# Patient Record
Sex: Male | Born: 1995
Health system: Southern US, Community
[De-identification: ages and names within clinical notes are randomized; demographics above are authoritative.]

## PROBLEM LIST (undated history)

## (undated) DIAGNOSIS — F419 Anxiety disorder, unspecified: Secondary | ICD-10-CM

## (undated) DIAGNOSIS — L410 Pityriasis lichenoides et varioliformis acuta: Secondary | ICD-10-CM

## (undated) DIAGNOSIS — R760 Raised antibody titer: Secondary | ICD-10-CM

## (undated) DIAGNOSIS — M79676 Pain in unspecified toe(s): Secondary | ICD-10-CM

## (undated) DIAGNOSIS — N50819 Testicular pain, unspecified: Secondary | ICD-10-CM

## (undated) DIAGNOSIS — K580 Irritable bowel syndrome with diarrhea: Secondary | ICD-10-CM

## (undated) DIAGNOSIS — R3129 Other microscopic hematuria: Secondary | ICD-10-CM

## (undated) DIAGNOSIS — N509 Disorder of male genital organs, unspecified: Secondary | ICD-10-CM

## (undated) DIAGNOSIS — L98499 Non-pressure chronic ulcer of skin of other sites with unspecified severity: Secondary | ICD-10-CM

## (undated) DIAGNOSIS — M2550 Pain in unspecified joint: Secondary | ICD-10-CM

## (undated) DIAGNOSIS — R590 Localized enlarged lymph nodes: Secondary | ICD-10-CM

## (undated) DIAGNOSIS — A281 Cat-scratch disease: Secondary | ICD-10-CM

## (undated) HISTORY — DX: Cat-scratch disease: A28.1

## (undated) HISTORY — DX: Raised antibody titer: R76.0

## (undated) HISTORY — DX: Non-pressure chronic ulcer of skin of other sites with unspecified severity: L98.499

## (undated) HISTORY — DX: Anxiety disorder, unspecified: F41.9

## (undated) HISTORY — DX: Pityriasis lichenoides et varioliformis acuta: L41.0

## (undated) HISTORY — DX: Testicular pain, unspecified: N50.819

## (undated) HISTORY — DX: Disorder of male genital organs, unspecified: N50.9

## (undated) HISTORY — DX: Pain in unspecified toe(s): M79.676

## (undated) HISTORY — DX: Irritable bowel syndrome with diarrhea: K58.0

---
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
## 2013-01-03 ENCOUNTER — Ambulatory Visit (INDEPENDENT_AMBULATORY_CARE_PROVIDER_SITE_OTHER): Payer: 59 | Admitting: Family Medicine

## 2013-01-03 ENCOUNTER — Encounter: Payer: Self-pay | Admitting: Family Medicine

## 2013-01-03 VITALS — BP 116/67 | HR 73 | Ht 72.0 in | Wt 170.0 lb

## 2013-01-03 DIAGNOSIS — M79609 Pain in unspecified limb: Secondary | ICD-10-CM

## 2013-01-03 DIAGNOSIS — M79671 Pain in right foot: Secondary | ICD-10-CM

## 2013-01-03 NOTE — Assessment & Plan Note (Signed)
Bilateral plantar fasciitis - shown home exercise program to do daily.  Arch binders provided and encouraged to use his otc orthotics regularly.  Icing, tylenol/nsaids as needed.  Avoid flat shoes and barefoot walking.  Consider injection, PT if not improving.  F/u in 1 month.

## 2013-01-03 NOTE — Patient Instructions (Addendum)
You have plantar fasciitis Take tylenol or aleve as needed for pain  Plantar fascia stretch for 20-30 seconds (do 3 of these) in morning Lowering/raise on a step exercises 3 x 10 once or twice a day - this is very important for long term recovery. Can add heel walks, toe walks forward and backward as well Ice heel for 15 minutes as needed. Avoid flat shoes/barefoot walking as much as possible. Arch straps have been shown to help with pain - wear these when up and walking around. Orthotics with heel lift may be helpful - use the Dr. Jari Sportsman ones regularly. Steroid injection is a consideration for short term pain relief if you are struggling. Physical therapy is also an option. Follow up with me in 1 month for reevaluation.  If your wrists become bothersome enough I would recommend trying cockup wrist splints when you sleep as the first step.

## 2013-01-03 NOTE — Progress Notes (Signed)
Patient ID: Cody Todd, male   DOB: Apr 16, 1996, 17 y.o.   MRN: [id number]  PCP: Ferman Hamming, MD  Subjective:   HPI: Patient is a 17 y.o. male here for bilateral foot pain.  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 About 2 months ago started to get left worse than right heel pain plantar part of heels. Tried some dr. Jari Sportsman inserts., icing, ice bottle massage. No swelling or bruising. Worse at end of day and when on feet a lot. Difficult to put weight on either heel.  Past Medical History  Diagnosis Date  . Pityriasis lichenoides 02/11/2011  . ADHD (attention deficit hyperactivity disorder)   . Sinusitis     2003,2007,2012   . Anxiety   . Irritable bowel syndrome with diarrhea 03/29/2012    No current outpatient prescriptions on file prior to visit.   No current facility-administered medications on file prior to visit.    Past Surgical History  Procedure Laterality Date  . Skin biopsy      Dr. Doristine Section, pityriasis lichenoides    No Known Allergies  History   Social History  . Marital Status: Single    Spouse Name: N/A    Number of Children: N/A  . Years of Education: N/A   Occupational History  . Not on file.   Social History Main Topics  . Smoking status: Never Smoker   . Smokeless tobacco: Never Used  . Alcohol Use: No  . Drug Use: 1.00 per week    Special: Marijuana     Comment: was using more, but has cut down a lot since has girlfriend. Feels less anxious.  . Sexually Active: Not Currently -- Male partner(s)    Birth Control/ Protection: Condom     Comment: girlfriend on BCP but states they are not having sex yet, but he has condoms   Other Topics Concern  . Not on file   Social History Narrative   Lives with mom and dad and 3 brothers.   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           Family History  Problem Relation Age of Onset  . Obesity Father   . ADD / ADHD Brother   . Depression Brother   . Mitral valve  prolapse Maternal Grandmother   . Cancer Maternal Grandmother     uterine  . Dementia Maternal Grandfather   . Thyroid disease Maternal Grandfather   . Cancer Paternal Grandfather     renal  . Hypertension Mother     BP 116/67  Pulse 73  Ht 6' (1.829 m)  Wt 170 lb (77.111 kg)  BMI 23.05 kg/m2  Review of Systems: See HPI above.    Objective:  Physical Exam:  Gen: NAD  Bilateral feet/ankles: No gross deformity, swelling, ecchymoses Mild cavus. FROM ankles with 5/5 strength all directions. TTP anterior plantar calcaneus bilaterally.  No other TTP feet/ankles. Negative ant drawer and talar tilt.   Negative syndesmotic compression. Thompsons test negative. NV intact distally.    Assessment & Plan:  1. Bilateral plantar fasciitis - shown home exercise program to do daily.  Arch binders provided and encouraged to use his otc orthotics regularly.  Icing, tylenol/nsaids as needed.  Avoid flat shoes and barefoot walking.  Consider injection, PT if not improving.  F/u in 1 month.

## 2013-01-17 ENCOUNTER — Ambulatory Visit
Admission: RE | Admit: 2013-01-17 | Discharge: 2013-01-17 | Disposition: A | Discharge status: Home / Self Care | Payer: 59 | Source: Ambulatory Visit | Attending: Orthopedic Surgery | Admitting: Orthopedic Surgery

## 2013-01-17 ENCOUNTER — Other Ambulatory Visit: Payer: Self-pay | Admitting: Orthopedic Surgery

## 2013-01-17 DIAGNOSIS — S62002B Unspecified fracture of navicular [scaphoid] bone of left wrist, initial encounter for open fracture: Secondary | ICD-10-CM

## 2013-01-31 ENCOUNTER — Ambulatory Visit: Payer: 59 | Admitting: Family Medicine

## 2013-05-26 ENCOUNTER — Ambulatory Visit (INDEPENDENT_AMBULATORY_CARE_PROVIDER_SITE_OTHER): Payer: 59 | Admitting: Pediatrics

## 2013-05-26 DIAGNOSIS — Z23 Encounter for immunization: Secondary | ICD-10-CM

## 2013-05-27 NOTE — Progress Notes (Signed)
Presented today for flu vaccine. No new questions on vaccine. Parent was counseled on risks benefits of vaccine and parent verbalized understanding. Handout (VIS) given for each vaccine. 

## 2013-11-07 ENCOUNTER — Encounter: Payer: Self-pay | Admitting: Family Medicine

## 2013-11-07 ENCOUNTER — Ambulatory Visit (INDEPENDENT_AMBULATORY_CARE_PROVIDER_SITE_OTHER): Payer: 59 | Admitting: Family Medicine

## 2013-11-07 ENCOUNTER — Other Ambulatory Visit: Payer: Self-pay | Admitting: *Deleted

## 2013-11-07 ENCOUNTER — Ambulatory Visit (HOSPITAL_BASED_OUTPATIENT_CLINIC_OR_DEPARTMENT_OTHER)
Admission: RE | Admit: 2013-11-07 | Discharge: 2013-11-07 | Disposition: A | Discharge status: Home / Self Care | Payer: 59 | Source: Ambulatory Visit | Attending: Family Medicine | Admitting: Family Medicine

## 2013-11-07 VITALS — BP 118/75 | HR 98 | Ht 72.0 in | Wt 165.0 lb

## 2013-11-07 DIAGNOSIS — M25579 Pain in unspecified ankle and joints of unspecified foot: Secondary | ICD-10-CM | POA: Insufficient documentation

## 2013-11-07 DIAGNOSIS — M25571 Pain in right ankle and joints of right foot: Secondary | ICD-10-CM

## 2013-11-07 NOTE — Progress Notes (Addendum)
Patient ID: Cody Todd, male   DOB: 1995/12/18, 18 y.o.   MRN: [id number]  PCP: Gaynelle Arabian, MD  Subjective:   HPI: Patient is a 18 y.o. male here for right ankle pain.  Patient reports for about 2 months now he has had deep lateral ankle pain. No known injury to start with. Gets sharp stabbing sensation taht causes ankle to give out at times. Radiates to inner part of ankle. Ankle feels weak also. Has been wrapping, doing some exercises without much beneft. Takes advil as needed. No prior issues.  Past Medical History  Diagnosis Date  . 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 ADHD (attention deficit hyperactivity disorder)   . Sinusitis     2003,2007,2012   . Anxiety   . Irritable bowel syndrome with diarrhea 03/29/2012    No current outpatient prescriptions on file prior to visit.   No current facility-administered medications on file prior to visit.    Past Surgical History  Procedure Laterality Date  . Skin biopsy      Dr. Vanessa Kick, pityriasis lichenoides    No Known Allergies  History   Social History  . Marital Status: Single    Spouse Name: N/A    Number of Children: N/A  . Years of Education: N/A   Occupational History  . Not on file.   Social History Main Topics  . Smoking status: Never Smoker   . Smokeless tobacco: Never Used  . Alcohol Use: No  . Drug Use: 1.00 per week    Special: Marijuana     Comment: was using more, but has cut down a lot since has girlfriend. Feels less anxious.  . Sexual Activity: Not Currently    Partners: Female    Birth Control/ Protection: Condom     Comment: girlfriend on BCP but states they are not having sex yet, but he has condoms   Other Topics Concern  . Not on file   Social History Narrative   Lives with mom and dad and 3 brothers.   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           Family History  Problem Relation Age of Onset  . Obesity Father   . ADD / ADHD Brother   . Depression Brother   . Mitral  valve prolapse Maternal Grandmother   . Cancer Maternal Grandmother     uterine  . Dementia Maternal Grandfather   . Thyroid disease Maternal Grandfather   . Cancer Paternal Grandfather     renal  . Hypertension Mother     BP 118/75  Pulse 98  Ht 6' (1.829 m)  Wt 165 lb (74.844 kg)  BMI 22.37 kg/m2  Review of Systems: See HPI above.    Objective:  Physical Exam:  Gen: NAD  Right ankle: No gross deformity, swelling, ecchymoses FROM with pain on IR. TTP at sinus tarsi region mildly (reports pain feels deeper than this). Negative ant drawer and talar tilt.   Negative syndesmotic compression. Thompsons test negative. NV intact distally.    Assessment & Plan:  1. Right ankle pain - concerning for ankle impingement vs sinus tarsi vs OCD.  No injury to suggest OCD but radiographs being done today to assess.  Ankle impingement most likely diagnosis.  Ankle brace, better arch supports (sports insoles with scaphoid pads provided), icing, tylenol, aleve.  Theraband strengthening exercises.  F/u in 1 month to 6 weeks.  Addendum:  Radiographs reviewed, discussed with patient and his mother.  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  Also does not have a known injury that would correspond to this diagnosis.  Possible this may be an old chronic finding.  Regardless will get MRI as soon as possible to assess old vs new and staging.  Has been ambulatory on this for 2 months already.  Based on results may need surgery referral, restricted weight bearing, or continue current treatment and consider injection (if chronic).  Addendum:  Patient brought in with mother and reviewed MRI.  The OCD has surrounding edema and is not an unstable lesion, appears to be Grade 1 or 2.  Based on size will refer to foot/ankle specialist.  In meantime placed in posterior splint with crutches here in the office - this will start conservative management should this be the option  recommended by specialist.  Out of work in the meantime.

## 2013-11-07 NOTE — Assessment & Plan Note (Signed)
concerning for ankle impingement vs sinus tarsi vs OCD.  No injury to suggest OCD but radiographs being done today to assess.  Ankle impingement most likely diagnosis.  Ankle brace, better arch supports (sports insoles with scaphoid pads provided), icing, tylenol, aleve.  Theraband strengthening exercises.  F/u in 1 month to 6 weeks.

## 2013-11-07 NOTE — Patient Instructions (Signed)
Your history and exam are most consistent with ankle impingement which is difficult to treat. Wear ankle brace for support when up and walking around. Arch supports when up and walking also. Avoid barefoot walking, flat shoes as much as possible. Get x-rays today before you leave. Icing, tylenol, aleve if needed. Rest when possible at work. Start theraband strengthening exercises - once a day 3 sets of 10. Follow up with me in 1 month to 6 weeks for reevaluation.

## 2013-11-10 ENCOUNTER — Ambulatory Visit (HOSPITAL_COMMUNITY)
Admission: RE | Admit: 2013-11-10 | Discharge: 2013-11-10 | Disposition: A | Discharge status: Home / Self Care | Payer: 59 | Source: Ambulatory Visit | Attending: Family Medicine | Admitting: Family Medicine

## 2013-11-10 DIAGNOSIS — M79609 Pain in unspecified limb: Secondary | ICD-10-CM | POA: Insufficient documentation

## 2013-11-10 DIAGNOSIS — M25571 Pain in right ankle and joints of right foot: Secondary | ICD-10-CM

## 2013-11-11 ENCOUNTER — Encounter: Payer: Self-pay | Admitting: Family Medicine

## 2013-11-11 ENCOUNTER — Ambulatory Visit: Payer: 59 | Admitting: Family Medicine

## 2013-11-18 ENCOUNTER — Telehealth: Payer: Self-pay | Admitting: Family Medicine

## 2013-11-22 ENCOUNTER — Other Ambulatory Visit: Payer: Self-pay | Admitting: *Deleted

## 2013-11-22 DIAGNOSIS — M25571 Pain in right ankle and joints of right foot: Secondary | ICD-10-CM

## 2013-11-25 ENCOUNTER — Ambulatory Visit: Payer: 59 | Admitting: Family Medicine

## 2013-12-01 ENCOUNTER — Encounter (HOSPITAL_BASED_OUTPATIENT_CLINIC_OR_DEPARTMENT_OTHER): Payer: Self-pay | Admitting: *Deleted

## 2013-12-08 ENCOUNTER — Encounter (HOSPITAL_BASED_OUTPATIENT_CLINIC_OR_DEPARTMENT_OTHER): Payer: Self-pay | Admitting: *Deleted

## 2013-12-08 ENCOUNTER — Ambulatory Visit (HOSPITAL_BASED_OUTPATIENT_CLINIC_OR_DEPARTMENT_OTHER): Admission: RE | Admit: 2013-12-08 | Payer: 59 | Source: Ambulatory Visit | Admitting: Orthopedic Surgery

## 2013-12-08 SURGERY — ARTHROSCOPY, ANKLE
Anesthesia: General | Site: Ankle | Laterality: Right

## 2013-12-13 ENCOUNTER — Ambulatory Visit: Payer: 59 | Admitting: Family Medicine

## 2013-12-14 ENCOUNTER — Other Ambulatory Visit: Payer: Self-pay | Admitting: Orthopedic Surgery

## 2013-12-15 ENCOUNTER — Encounter (HOSPITAL_BASED_OUTPATIENT_CLINIC_OR_DEPARTMENT_OTHER): Payer: Self-pay | Admitting: Certified Registered"

## 2013-12-15 ENCOUNTER — Ambulatory Visit (HOSPITAL_BASED_OUTPATIENT_CLINIC_OR_DEPARTMENT_OTHER)
Admission: RE | Admit: 2013-12-15 | Discharge: 2013-12-15 | Disposition: A | Discharge status: Home / Self Care | Payer: 59 | Source: Ambulatory Visit | Attending: Orthopedic Surgery | Admitting: Orthopedic Surgery

## 2013-12-15 ENCOUNTER — Ambulatory Visit (HOSPITAL_BASED_OUTPATIENT_CLINIC_OR_DEPARTMENT_OTHER): Payer: 59 | Admitting: Certified Registered"

## 2013-12-15 ENCOUNTER — Encounter (HOSPITAL_BASED_OUTPATIENT_CLINIC_OR_DEPARTMENT_OTHER): Payer: 59 | Admitting: Certified Registered"

## 2013-12-15 ENCOUNTER — Encounter (HOSPITAL_BASED_OUTPATIENT_CLINIC_OR_DEPARTMENT_OTHER)
Admission: RE | Disposition: A | Discharge status: Home / Self Care | Payer: Self-pay | Source: Ambulatory Visit | Attending: Orthopedic Surgery

## 2013-12-15 DIAGNOSIS — M659 Unspecified synovitis and tenosynovitis, unspecified site: Secondary | ICD-10-CM | POA: Insufficient documentation

## 2013-12-15 DIAGNOSIS — M899 Disorder of bone, unspecified: Secondary | ICD-10-CM

## 2013-12-15 DIAGNOSIS — M949 Disorder of cartilage, unspecified: Secondary | ICD-10-CM

## 2013-12-15 DIAGNOSIS — M959 Acquired deformity of musculoskeletal system, unspecified: Secondary | ICD-10-CM | POA: Insufficient documentation

## 2013-12-15 DIAGNOSIS — E739 Lactose intolerance, unspecified: Secondary | ICD-10-CM | POA: Insufficient documentation

## 2013-12-15 DIAGNOSIS — L419 Parapsoriasis, unspecified: Secondary | ICD-10-CM | POA: Insufficient documentation

## 2013-12-15 DIAGNOSIS — K589 Irritable bowel syndrome without diarrhea: Secondary | ICD-10-CM | POA: Insufficient documentation

## 2013-12-15 HISTORY — PX: ANKLE ARTHROSCOPY WITH DRILLING/MICROFRACTURE: SHX5580

## 2013-12-15 LAB — POCT HEMOGLOBIN-HEMACUE: Hemoglobin: 15.6 g/dL (ref 13.0–17.0)

## 2013-12-15 SURGERY — ARTHROSCOPY, ANKLE, WITH MICROFRACTURE
Anesthesia: General | Laterality: Right

## 2013-12-15 MED ORDER — OXYCODONE HCL 5 MG PO TABS
5.0000 mg | ORAL_TABLET | Freq: Once | ORAL | Status: AC | PRN
  Administered 2013-12-15: 5 mg via ORAL

## 2013-12-15 MED ORDER — BUPIVACAINE-EPINEPHRINE 0.5% -1:200000 IJ SOLN
INTRAMUSCULAR | Status: DC | PRN
  Administered 2013-12-15: 7 mL

## 2013-12-15 MED ORDER — LIDOCAINE HCL (CARDIAC) 20 MG/ML IV SOLN
INTRAVENOUS | Status: DC | PRN
  Administered 2013-12-15: 60 mg via INTRAVENOUS

## 2013-12-15 MED ORDER — MIDAZOLAM HCL 5 MG/5ML IJ SOLN
INTRAMUSCULAR | Status: DC | PRN
  Administered 2013-12-15: 2 mg via INTRAVENOUS

## 2013-12-15 MED ORDER — DEXAMETHASONE SODIUM PHOSPHATE 10 MG/ML IJ SOLN
INTRAMUSCULAR | Status: DC | PRN
  Administered 2013-12-15: 10 mg via INTRAVENOUS

## 2013-12-15 MED ORDER — PROPOFOL 10 MG/ML IV EMUL
INTRAVENOUS | Status: AC
  Filled 2013-12-15: qty 100

## 2013-12-15 MED ORDER — MIDAZOLAM HCL 2 MG/2ML IJ SOLN: 1.0000 mg | INTRAMUSCULAR | Status: DC | PRN

## 2013-12-15 MED ORDER — ASPIRIN EC 325 MG PO TBEC: 325.0000 mg | DELAYED_RELEASE_TABLET | Freq: Every day | ORAL | Status: DC

## 2013-12-15 MED ORDER — OXYCODONE HCL 5 MG/5ML PO SOLN: 5.0000 mg | Freq: Once | ORAL | Status: AC | PRN

## 2013-12-15 MED ORDER — ONDANSETRON HCL 4 MG/2ML IJ SOLN
INTRAMUSCULAR | Status: DC | PRN
  Administered 2013-12-15: 4 mg via INTRAVENOUS

## 2013-12-15 MED ORDER — MIDAZOLAM HCL 2 MG/ML PO SYRP: 12.0000 mg | ORAL_SOLUTION | Freq: Once | ORAL | Status: DC | PRN

## 2013-12-15 MED ORDER — PROPOFOL 10 MG/ML IV BOLUS
INTRAVENOUS | Status: DC | PRN
  Administered 2013-12-15: 200 mg via INTRAVENOUS

## 2013-12-15 MED ORDER — OXYCODONE HCL 5 MG PO TABS
ORAL_TABLET | ORAL | Status: AC
  Filled 2013-12-15: qty 1

## 2013-12-15 MED ORDER — CEFAZOLIN SODIUM-DEXTROSE 2-3 GM-% IV SOLR
2.0000 g | INTRAVENOUS | Status: AC
  Administered 2013-12-15: 2 g via INTRAVENOUS

## 2013-12-15 MED ORDER — HYDROMORPHONE HCL PF 1 MG/ML IJ SOLN
0.2500 mg | INTRAMUSCULAR | Status: DC | PRN
  Administered 2013-12-15 (×4): 0.5 mg via INTRAVENOUS

## 2013-12-15 MED ORDER — CEFAZOLIN SODIUM-DEXTROSE 2-3 GM-% IV SOLR
INTRAVENOUS | Status: AC
  Filled 2013-12-15: qty 50

## 2013-12-15 MED ORDER — MIDAZOLAM HCL 2 MG/2ML IJ SOLN
INTRAMUSCULAR | Status: AC
  Filled 2013-12-15: qty 2

## 2013-12-15 MED ORDER — FENTANYL CITRATE 0.05 MG/ML IJ SOLN
INTRAMUSCULAR | Status: DC | PRN
  Administered 2013-12-15 (×2): 50 ug via INTRAVENOUS
  Administered 2013-12-15 (×2): 25 ug via INTRAVENOUS

## 2013-12-15 MED ORDER — FENTANYL CITRATE 0.05 MG/ML IJ SOLN: 50.0000 ug | INTRAMUSCULAR | Status: DC | PRN

## 2013-12-15 MED ORDER — CHLORHEXIDINE GLUCONATE 4 % EX LIQD: 60.0000 mL | Freq: Once | CUTANEOUS | Status: DC

## 2013-12-15 MED ORDER — SODIUM CHLORIDE 0.9 % IV SOLN: INTRAVENOUS | Status: DC

## 2013-12-15 MED ORDER — OXYCODONE HCL 5 MG PO TABS: 5.0000 mg | ORAL_TABLET | ORAL | Status: DC | PRN

## 2013-12-15 MED ORDER — BACITRACIN ZINC 500 UNIT/GM EX OINT
TOPICAL_OINTMENT | CUTANEOUS | Status: AC
  Filled 2013-12-15: qty 28.35

## 2013-12-15 MED ORDER — BUPIVACAINE-EPINEPHRINE (PF) 0.5% -1:200000 IJ SOLN
INTRAMUSCULAR | Status: AC
  Filled 2013-12-15: qty 30

## 2013-12-15 MED ORDER — FENTANYL CITRATE 0.05 MG/ML IJ SOLN
INTRAMUSCULAR | Status: AC
  Filled 2013-12-15: qty 6

## 2013-12-15 MED ORDER — IBUPROFEN 200 MG PO TABS
800.0000 mg | ORAL_TABLET | Freq: Three times a day (TID) | ORAL | Status: DC | PRN
Start: 2013-12-15 — End: 2014-07-06

## 2013-12-15 MED ORDER — BACITRACIN ZINC 500 UNIT/GM EX OINT
TOPICAL_OINTMENT | CUTANEOUS | Status: AC
  Filled 2013-12-15: qty 113.4

## 2013-12-15 MED ORDER — HYDROMORPHONE HCL PF 1 MG/ML IJ SOLN
INTRAMUSCULAR | Status: AC
  Filled 2013-12-15: qty 1

## 2013-12-15 MED ORDER — LACTATED RINGERS IV SOLN
INTRAVENOUS | Status: DC
  Administered 2013-12-15 (×2): via INTRAVENOUS

## 2013-12-15 SURGICAL SUPPLY — 82 items
BANDAGE ESMARK 6X9 LF (GAUZE/BANDAGES/DRESSINGS) ×1 IMPLANT
BLADE CUDA 2.0 (BLADE) IMPLANT
BLADE CUDA GRT WHITE 3.5 (BLADE) IMPLANT
BLADE CUDA SHAVER 3.5 (BLADE) IMPLANT
BLADE CUTTER GATOR 3.5 (BLADE) IMPLANT
BLADE SURG 15 STRL LF DISP TIS (BLADE) ×1 IMPLANT
BLADE SURG 15 STRL SS (BLADE) ×2
BNDG COHESIVE 4X5 TAN STRL (GAUZE/BANDAGES/DRESSINGS) ×3 IMPLANT
BNDG COHESIVE 6X5 TAN STRL LF (GAUZE/BANDAGES/DRESSINGS) ×3 IMPLANT
BNDG ESMARK 6X9 LF (GAUZE/BANDAGES/DRESSINGS) ×3
BOOT STEPPER DURA LG (SOFTGOODS) IMPLANT
BOOT STEPPER DURA MED (SOFTGOODS) IMPLANT
BOOT STEPPER DURA SM (SOFTGOODS) IMPLANT
BUR 3.5 LG SPHERICAL (BURR) IMPLANT
BUR CUDA 2.9 (BURR) IMPLANT
BUR CUDA 2.9MM (BURR)
BUR FULL RADIUS 2.9 (BURR) ×2 IMPLANT
BUR FULL RADIUS 2.9MM (BURR) ×1
BUR GATOR 2.9 (BURR) IMPLANT
BUR GATOR 2.9MM (BURR)
BUR OVAL 4.0 (BURR) IMPLANT
BUR SPHERICAL 2.9 (BURR) IMPLANT
BUR SPHERICAL 2.9MM (BURR)
BUR VERTEX HOODED 4.5 (BURR) IMPLANT
BURR 3.5 LG SPHERICAL (BURR)
BURR 3.5MM LG SPHERICAL (BURR)
CANISTER SUCT 3000ML (MISCELLANEOUS) IMPLANT
CHLORAPREP W/TINT 26ML (MISCELLANEOUS) ×3 IMPLANT
CUFF TOURNIQUET SINGLE 34IN LL (TOURNIQUET CUFF) ×3 IMPLANT
DRAPE EXTREMITY T 121X128X90 (DRAPE) ×3 IMPLANT
DRAPE OEC MINIVIEW 54X84 (DRAPES) IMPLANT
DRAPE U-SHAPE 47X51 STRL (DRAPES) ×3 IMPLANT
DRSG EMULSION OIL 3X3 NADH (GAUZE/BANDAGES/DRESSINGS) ×3 IMPLANT
DRSG PAD ABDOMINAL 8X10 ST (GAUZE/BANDAGES/DRESSINGS) ×3 IMPLANT
ELECT REM PT RETURN 9FT ADLT (ELECTROSURGICAL) ×3
ELECTRODE REM PT RTRN 9FT ADLT (ELECTROSURGICAL) ×1 IMPLANT
GAUZE SPONGE 4X4 12PLY STRL (GAUZE/BANDAGES/DRESSINGS) ×3 IMPLANT
GLOVE BIO SURGEON STRL SZ8 (GLOVE) ×3 IMPLANT
GLOVE BIOGEL PI IND STRL 7.0 (GLOVE) ×1 IMPLANT
GLOVE BIOGEL PI IND STRL 8 (GLOVE) ×1 IMPLANT
GLOVE BIOGEL PI INDICATOR 7.0 (GLOVE) ×2
GLOVE BIOGEL PI INDICATOR 8 (GLOVE) ×2
GLOVE ECLIPSE 6.5 STRL STRAW (GLOVE) ×3 IMPLANT
GLOVE EXAM NITRILE MD LF STRL (GLOVE) IMPLANT
GOWN STRL REUS W/ TWL LRG LVL3 (GOWN DISPOSABLE) ×1 IMPLANT
GOWN STRL REUS W/ TWL XL LVL3 (GOWN DISPOSABLE) ×1 IMPLANT
GOWN STRL REUS W/TWL LRG LVL3 (GOWN DISPOSABLE) ×2
GOWN STRL REUS W/TWL XL LVL3 (GOWN DISPOSABLE) ×2
MANIFOLD NEPTUNE II (INSTRUMENTS) ×3 IMPLANT
PACK ARTHROSCOPY DSU (CUSTOM PROCEDURE TRAY) ×3 IMPLANT
PACK BASIN DAY SURGERY FS (CUSTOM PROCEDURE TRAY) ×3 IMPLANT
PAD CAST 4YDX4 CTTN HI CHSV (CAST SUPPLIES) ×1 IMPLANT
PADDING CAST ABS 4INX4YD NS (CAST SUPPLIES) ×2
PADDING CAST ABS COTTON 4X4 ST (CAST SUPPLIES) ×1 IMPLANT
PADDING CAST COTTON 4X4 STRL (CAST SUPPLIES) ×2
PADDING CAST COTTON 6X4 STRL (CAST SUPPLIES) ×3 IMPLANT
PENCIL BUTTON HOLSTER BLD 10FT (ELECTRODE) IMPLANT
SANITIZER HAND PURELL 535ML FO (MISCELLANEOUS) ×3 IMPLANT
SET IRRIG Y TYPE TUR BLADDER L (SET/KITS/TRAYS/PACK) ×3 IMPLANT
SLEEVE SCD COMPRESS KNEE MED (MISCELLANEOUS) ×3 IMPLANT
SPLINT FAST PLASTER 5X30 (CAST SUPPLIES) ×40
SPLINT PLASTER CAST FAST 5X30 (CAST SUPPLIES) ×20 IMPLANT
SPONGE LAP 18X18 X RAY DECT (DISPOSABLE) IMPLANT
STOCKINETTE 6  STRL (DRAPES) ×2
STOCKINETTE 6 STRL (DRAPES) ×1 IMPLANT
STRAP ANKLE FOOT DISTRACTOR (ORTHOPEDIC SUPPLIES) ×3 IMPLANT
SUT ETHILON 3 0 PS 1 (SUTURE) ×3 IMPLANT
SUT MNCRL AB 3-0 PS2 18 (SUTURE) IMPLANT
SUT VIC AB 0 CT1 27 (SUTURE)
SUT VIC AB 0 CT1 27XBRD ANBCTR (SUTURE) IMPLANT
SUT VIC AB 2-0 SH 18 (SUTURE) IMPLANT
SUT VIC AB 2-0 SH 27 (SUTURE)
SUT VIC AB 2-0 SH 27XBRD (SUTURE) IMPLANT
SUT VIC AB 3-0 PS1 18 (SUTURE)
SUT VIC AB 3-0 PS1 18XBRD (SUTURE) IMPLANT
SYR BULB 3OZ (MISCELLANEOUS) IMPLANT
TOWEL OR 17X24 6PK STRL BLUE (TOWEL DISPOSABLE) ×3 IMPLANT
TOWEL OR NON WOVEN STRL DISP B (DISPOSABLE) ×3 IMPLANT
TUBE CONNECTING 20'X1/4 (TUBING)
TUBE CONNECTING 20X1/4 (TUBING) IMPLANT
WAND STAR VAC 90 (SURGICAL WAND) IMPLANT
WATER STERILE IRR 1000ML POUR (IV SOLUTION) ×3 IMPLANT

## 2013-12-15 NOTE — Discharge Instructions (Signed)
Cody Hewitt, MD ° Orthopaedics ° °Please read the following information regarding your care after surgery. ° °Medications  °You only need a prescription for the narcotic pain medicine (ex. oxycodone, Percocet, Norco).  All of the other medicines listed below are available over the counter. °X acetominophen (Tylenol) 650 mg every 4-6 hours as you need for minor pain °X oxycodone as prescribed for moderate to severe pain  ° °Narcotic pain medicine (ex. oxycodone, Percocet, Vicodin) will cause constipation.  To prevent this problem, take the following medicines while you are taking any pain medicine. °X docusate sodium (Colace) 100 mg twice a day X senna (Senokot) 2 tablets twice a day ° °X To help prevent blood clots, take an aspirin (325 mg) once a day for a month after surgery.  You should also get up every hour while you are awake to move around.   ° °Weight Bearing °? Bear weight when you are able on your operated leg or foot. °? Bear weight only on the heel of your operated foot in the post-op shoe. °X Do not bear any weight on the operated leg or foot. ° °Cast / Splint / Dressing °X Keep your splint or cast clean and dry.  Don’t put anything (coat hanger, pencil, etc) down inside of it.  If it gets damp, use a hair dryer on the cool setting to dry it.  If it gets soaked, call the office to schedule an appointment for a cast change. °? Remove your dressing 3 days after surgery and cover the incisions with dry dressings.   ° °After your dressing, cast or splint is removed; you may shower, but do not soak or scrub the wound.  Allow the water to run over it, and then gently pat it dry. ° °Swelling °It is normal for you to have swelling where you had surgery.  To reduce swelling and pain, keep your toes above your nose for at least 3 days after surgery.  It may be necessary to keep your foot or leg elevated for several weeks.  If it hurts, it should be elevated. ° °Follow Up °Call my office at [phone]  when you are discharged from the hospital or surgery center to schedule an appointment to be seen two weeks after surgery. ° °Call my office at [phone] if you develop a fever >101.5° F, nausea, vomiting, bleeding from the surgical site or severe pain.   ° ° °Post Anesthesia Home Care Instructions ° °Activity: °Get plenty of rest for the remainder of the day. A responsible adult should stay with you for 24 hours following the procedure.  °For the next 24 hours, DO NOT: °-Drive a car °-Operate machinery °-Drink alcoholic beverages °-Take any medication unless instructed by your physician °-Make any legal decisions or sign important papers. ° °Meals: °Start with liquid foods such as gelatin or soup. Progress to regular foods as tolerated. Avoid greasy, spicy, heavy foods. If nausea and/or vomiting occur, drink only clear liquids until the nausea and/or vomiting subsides. Call your physician if vomiting continues. ° °Special Instructions/Symptoms: °Your throat may feel dry or sore from the anesthesia or the breathing tube placed in your throat during surgery. If this causes discomfort, gargle with warm salt water. The discomfort should disappear within 24 hours. ° °

## 2013-12-15 NOTE — Anesthesia Postprocedure Evaluation (Signed)
  Anesthesia Post-op Note  Patient: Cody Todd  Procedure(s) Performed: Procedure(s): RIGHT ANKLE ARTHROSCOPY WITH MICRO FRACTURE  (Right)  Patient Location: PACU  Anesthesia Type:General  Level of Consciousness: awake and alert   Airway and Oxygen Therapy: Patient Spontanous Breathing  Post-op Pain: moderate  Post-op Assessment: Post-op Vital signs reviewed, Patient's Cardiovascular Status Stable and Respiratory Function Stable  Post-op Vital Signs: Reviewed  Filed Vitals:   12/15/13 1045  BP: 106/57  Pulse: 75  Temp:   Resp: 17    Complications: No apparent anesthesia complications

## 2013-12-15 NOTE — Anesthesia Procedure Notes (Signed)
Procedure Name: LMA Insertion Date/Time: 12/15/2013 8:18 AM Performed by: Jillene Wehrenberg Pre-anesthesia Checklist: Patient identified, Emergency Drugs available, Suction available and Patient being monitored Patient Re-evaluated:Patient Re-evaluated prior to inductionOxygen Delivery Method: Circle System Utilized Preoxygenation: Pre-oxygenation with 100% oxygen Intubation Type: IV induction Ventilation: Mask ventilation without difficulty LMA: LMA inserted LMA Size: 4.0 Number of attempts: 1 Airway Equipment and Method: bite block Placement Confirmation: positive ETCO2 Tube secured with: Tape Dental Injury: Teeth and Oropharynx as per pre-operative assessment

## 2013-12-15 NOTE — Transfer of Care (Signed)
Immediate Anesthesia Transfer of Care Note  Patient: Cody Todd  Procedure(s) Performed: Procedure(s): RIGHT ANKLE ARTHROSCOPY WITH MICRO FRACTURE  (Right)  Patient Location: PACU  Anesthesia Type:General  Level of Consciousness: awake and patient cooperative  Airway & Oxygen Therapy: Patient Spontanous Breathing and Patient connected to face mask oxygen  Post-op Assessment: Report given to PACU RN and Post -op Vital signs reviewed and stable  Post vital signs: Reviewed and stable  Complications: No apparent anesthesia complications

## 2013-12-15 NOTE — Anesthesia Preprocedure Evaluation (Addendum)
Anesthesia Evaluation  Patient identified by MRN, date of birth, ID band Patient awake    Reviewed: Allergy & Precautions, H&P , NPO status , Patient's Chart, lab work & pertinent test results  Airway Mallampati: I TM Distance: >3 FB Neck ROM: Full    Dental no notable dental hx. (+) Teeth Intact, Dental Advisory Given   Pulmonary neg pulmonary ROS,  breath sounds clear to auscultation  Pulmonary exam normal       Cardiovascular negative cardio ROS  Rhythm:Regular Rate:Normal     Neuro/Psych Anxiety negative neurological ROS  negative psych ROS   GI/Hepatic Neg liver ROS, IBS   Endo/Other  negative endocrine ROS  Renal/GU negative Renal ROS  negative genitourinary   Musculoskeletal   Abdominal   Peds  Hematology negative hematology ROS (+)   Anesthesia Other Findings   Reproductive/Obstetrics negative OB ROS                          Anesthesia Physical Anesthesia Plan  ASA: I  Anesthesia Plan: General   Post-op Pain Management:    Induction: Intravenous  Airway Management Planned: LMA  Additional Equipment:   Intra-op Plan:   Post-operative Plan: Extubation in OR  Informed Consent: I have reviewed the patients History and Physical, chart, labs and discussed the procedure including the risks, benefits and alternatives for the proposed anesthesia with the patient or authorized representative who has indicated his/her understanding and acceptance.   Dental advisory given  Plan Discussed with: CRNA  Anesthesia Plan Comments: (Pt declines block.)       Anesthesia Quick Evaluation

## 2013-12-15 NOTE — Brief Op Note (Signed)
12/15/2013  9:52 AM  PATIENT:  Cody Todd  18 y.o. 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  DICTATION ID:  [id number]

## 2013-12-15 NOTE — H&P (Signed)
Cody Todd is an 18 y.o. male.   Chief Complaint: right ankle pain HPI: 18 y/o male with right ankle pain from an osteochondral defect of the talar dome.  He presents now for arthroscopy.  Past Medical History  Diagnosis Date  . Irritable bowel syndrome with diarrhea     no current med.  . Pityriasis lichenoides     arms and legs  . 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 No past surgeries      Family History  Problem Relation Age of Onset  . Obesity Father   . ADD / ADHD Brother   . Depression Brother   . Mitral valve prolapse Maternal Grandmother   . Cancer Maternal Grandmother     uterine  . Dementia Maternal Grandfather   . Thyroid disease Maternal Grandfather   . Cancer Paternal Grandfather     renal  . Hypertension Mother    Social History:  reports that he has never smoked. He has never used smokeless tobacco. He reports that he does not drink alcohol or use illicit drugs.  Allergies:  Allergies  Allergen Reactions  . Lactose Intolerance (Gi) Diarrhea    Medications Prior to Admission  Medication Sig Dispense Refill  . acetaminophen (TYLENOL) 325 MG tablet Take 650 mg by mouth every 6 (six) hours as needed.      Marland Kitchen ibuprofen (ADVIL,MOTRIN) 200 MG tablet Take 200 mg by mouth every 6 (six) hours as needed.        Results for orders placed during the hospital encounter of 01-01-2014 (from the past 48 hour(s))  POCT HEMOGLOBIN-HEMACUE     Status: None   Collection Time    January 01, 2014  7:38 AM      Result Value Ref Range   Hemoglobin 15.6  13.0 - 17.0 g/dL   No results found.  ROS  No recent f/c/n/v/wt loss.  Blood pressure 137/77, resp. rate 20, SpO2 100.00%. Physical Exam  wn wd male in nad.  A and O x 4.  Mood and affect normal.  EOMI.  Resp unlabored. Right ankle with healthy skin, normal sens to LT and no lymphadenopathy.  5/5 strength in PF and DF of the ankle.  Sens to LT intact.  No  effusion.  Assessment/Plan Right ankle osteochondral lesion of the talus - to OR for arthroscopic debridement and microfracture.  The risks and benefits of the alternative treatment options have been discussed in detail.  The patient wishes to proceed with surgery and specifically understands risks of bleeding, infection, nerve damage, blood clots, need for additional surgery, amputation and death.   Cody Todd 2014-01-01, 8:08 AM

## 2013-12-16 NOTE — Op Note (Signed)
NAME:  CARDER, YIN NO.:  [id number]  MEDICAL RECORD NO.:  [id number]  LOCATION:                                 FACILITY:  PHYSICIAN:  Wylene Simmer, MD        DATE OF BIRTH:  [birth year]  DATE OF PROCEDURE:  12/15/2013 DATE OF DISCHARGE:                              OPERATIVE REPORT   PREOPERATIVE DIAGNOSIS:  Right ankle medial talar dome osteochondral lesion.  POSTOPERATIVE DIAGNOSIS:  Right ankle medial talar dome osteochondral lesion, right ankle anteromedial synovitis.  PROCEDURE: 1. Right ankle arthroscopy with limited debridement. 2. Right ankle arthroscopy with microfracture of the osteochondral     defect.  SURGEON:  Wylene Simmer, MD  ANESTHESIA:  General.  ESTIMATED BLOOD LOSS:  Minimal.  TOURNIQUET TIME:  43 minutes at 220 mmHg.  COMPLICATIONS:  None apparent.  DISPOSITION:  Extubated awake and stable to recovery.  INDICATIONS FOR PROCEDURE:  The patient is a 18 year old male who has had right ankle pain for the last several months.  MRI reveals an osteochondral lesion of the medial talar dome.  He presents now for operative treatment of this condition.  He understands the risks and benefits, the alternative treatment options, and elects surgical treatment.  He specifically understands risks of bleeding, infection, nerve damage, blood clots, need for additional surgery, amputation, and death.  PROCEDURE IN DETAIL:  After preoperative consent was obtained and the correct operative site was identified, the patient was brought to the operating room and placed supine on the operating table.  General anesthesia was induced.  Preoperative antibiotics were administered. Surgical time-out was taken.  The right lower extremity was prepped and draped in standard sterile fashion with a tourniquet around the thigh. The extremity was exsanguinated and tourniquet was inflated to 220 mmHg. The arthroscopy portal sites were infiltrated with 0.5%  Marcaine with epinephrine.  The anteromedial arthroscopy portal was established and the arthroscope was inserted into the ankle joint.  There was significant synovitis noted at the anteromedial joint line.  Medial gutter was clear of all foreign bodies or significant synovitis.  The anterior gutter was also free of foreign body, but here was some moderate amount of synovitis.  Anterolaterally, the joint appeared essentially normal.  The lateral gutter appeared healthy with no foreign body.  The tibial plafond was healthy throughout with normal-appearing cartilage.  The lateral and central portions of the talar dome all appeared healthy and the posterior gutter all had healthy cartilage and no foreign body or significant synovitis.  The medial talar dome was noted to have an area of disrupted cartilage with some fibrinous material.  This was probed down to the level of cystic lesion below that.  An anterolateral arthroscopy portal had been established under direct vision.  The arthroscopic shaver and a curette were used to remove the loose cartilage from the top of the cystic lesion.  The bone beneath appeared generally healthy and viable.  The microfracture awl was inserted through the anteromedial portal while the lesion was visualized from the anterolateral portal.  Several perforations were made in the bone with the microfracture awl.  After these were complete, the inflow was stopped.  There  was significant fat coming from the holes.  The arthroscopic instruments were removed.  All of the synovitis had been resected with a shaver from the anterior and medial joint line. The arthroscopic portals were closed with horizontal mattress sutures of 3-0 nylon.  Sterile dressings were applied followed by a well-padded short-leg splint.  Tourniquet was released at 43 minutes.  The patient was awakened from anesthesia and transported to the recovery room in stable condition.  FOLLOWUP PLAN:   The patient will be nonweightbearing on the right lower extremity for 2 weeks.  He will follow up with me then for suture removal and conversion to a CAM walker boot and to initiate range of motion.     Wylene Simmer, MD     JH/MEDQ  D:  12/15/2013  T:  12/16/2013  Job:  [id number]

## 2013-12-19 ENCOUNTER — Encounter (HOSPITAL_BASED_OUTPATIENT_CLINIC_OR_DEPARTMENT_OTHER): Payer: Self-pay | Admitting: Orthopedic Surgery

## 2014-02-14 ENCOUNTER — Ambulatory Visit: Payer: 59 | Admitting: Physical Therapy

## 2014-02-22 ENCOUNTER — Ambulatory Visit: Payer: 59 | Attending: Pediatrics | Admitting: Physical Therapy

## 2014-02-22 DIAGNOSIS — M25673 Stiffness of unspecified ankle, not elsewhere classified: Secondary | ICD-10-CM | POA: Diagnosis not present

## 2014-02-22 DIAGNOSIS — IMO0001 Reserved for inherently not codable concepts without codable children: Secondary | ICD-10-CM | POA: Diagnosis not present

## 2014-02-22 DIAGNOSIS — M25676 Stiffness of unspecified foot, not elsewhere classified: Secondary | ICD-10-CM | POA: Diagnosis not present

## 2014-02-22 DIAGNOSIS — M25579 Pain in unspecified ankle and joints of unspecified foot: Secondary | ICD-10-CM | POA: Diagnosis not present

## 2014-02-23 ENCOUNTER — Ambulatory Visit: Payer: 59 | Admitting: Physical Therapy

## 2014-02-23 DIAGNOSIS — IMO0001 Reserved for inherently not codable concepts without codable children: Secondary | ICD-10-CM | POA: Diagnosis not present

## 2014-02-28 ENCOUNTER — Ambulatory Visit: Payer: 59 | Admitting: Physical Therapy

## 2014-02-28 DIAGNOSIS — IMO0001 Reserved for inherently not codable concepts without codable children: Secondary | ICD-10-CM | POA: Diagnosis not present

## 2014-03-02 ENCOUNTER — Ambulatory Visit: Payer: 59 | Admitting: Physical Therapy

## 2014-03-02 DIAGNOSIS — IMO0001 Reserved for inherently not codable concepts without codable children: Secondary | ICD-10-CM | POA: Diagnosis not present

## 2014-03-06 ENCOUNTER — Ambulatory Visit: Payer: 59 | Admitting: Physical Therapy

## 2014-03-06 DIAGNOSIS — IMO0001 Reserved for inherently not codable concepts without codable children: Secondary | ICD-10-CM | POA: Diagnosis not present

## 2014-03-08 ENCOUNTER — Ambulatory Visit: Payer: 59 | Admitting: Physical Therapy

## 2014-03-08 DIAGNOSIS — IMO0001 Reserved for inherently not codable concepts without codable children: Secondary | ICD-10-CM | POA: Diagnosis not present

## 2014-03-15 ENCOUNTER — Encounter: Payer: 59 | Admitting: Physical Therapy

## 2014-03-17 ENCOUNTER — Ambulatory Visit: Payer: 59 | Attending: Pediatrics | Admitting: Physical Therapy

## 2014-03-17 DIAGNOSIS — IMO0001 Reserved for inherently not codable concepts without codable children: Secondary | ICD-10-CM | POA: Insufficient documentation

## 2014-03-17 DIAGNOSIS — M25579 Pain in unspecified ankle and joints of unspecified foot: Secondary | ICD-10-CM | POA: Diagnosis not present

## 2014-03-17 DIAGNOSIS — M25673 Stiffness of unspecified ankle, not elsewhere classified: Secondary | ICD-10-CM | POA: Insufficient documentation

## 2014-03-17 DIAGNOSIS — M25676 Stiffness of unspecified foot, not elsewhere classified: Secondary | ICD-10-CM | POA: Insufficient documentation

## 2014-03-26 ENCOUNTER — Emergency Department (HOSPITAL_COMMUNITY)
Admission: EM | Admit: 2014-03-26 | Discharge: 2014-03-26 | Disposition: A | Discharge status: Home / Self Care | Payer: 59 | Attending: Emergency Medicine | Admitting: Emergency Medicine

## 2014-03-26 ENCOUNTER — Encounter (HOSPITAL_COMMUNITY): Payer: Self-pay | Admitting: Emergency Medicine

## 2014-03-26 ENCOUNTER — Emergency Department (HOSPITAL_COMMUNITY): Payer: 59

## 2014-03-26 DIAGNOSIS — N509 Disorder of male genital organs, unspecified: Secondary | ICD-10-CM | POA: Diagnosis present

## 2014-03-26 DIAGNOSIS — Z8739 Personal history of other diseases of the musculoskeletal system and connective tissue: Secondary | ICD-10-CM | POA: Diagnosis not present

## 2014-03-26 DIAGNOSIS — N50812 Left testicular pain: Secondary | ICD-10-CM

## 2014-03-26 DIAGNOSIS — Z791 Long term (current) use of non-steroidal anti-inflammatories (NSAID): Secondary | ICD-10-CM | POA: Diagnosis not present

## 2014-03-26 DIAGNOSIS — Z872 Personal history of diseases of the skin and subcutaneous tissue: Secondary | ICD-10-CM | POA: Diagnosis not present

## 2014-03-26 DIAGNOSIS — R319 Hematuria, unspecified: Secondary | ICD-10-CM | POA: Insufficient documentation

## 2014-03-26 DIAGNOSIS — Z8719 Personal history of other diseases of the digestive system: Secondary | ICD-10-CM | POA: Diagnosis not present

## 2014-03-26 DIAGNOSIS — Z7982 Long term (current) use of aspirin: Secondary | ICD-10-CM | POA: Diagnosis not present

## 2014-03-26 LAB — URINALYSIS, ROUTINE W REFLEX MICROSCOPIC
Bilirubin Urine: NEGATIVE
Glucose, UA: NEGATIVE mg/dL
Ketones, ur: NEGATIVE mg/dL
LEUKOCYTES UA: NEGATIVE
NITRITE: NEGATIVE
Protein, ur: NEGATIVE mg/dL
SPECIFIC GRAVITY, URINE: 1.025 (ref 1.005–1.030)
UROBILINOGEN UA: 1 mg/dL (ref 0.0–1.0)
pH: 7 (ref 5.0–8.0)

## 2014-03-26 LAB — URINE MICROSCOPIC-ADD ON

## 2014-03-26 MED ORDER — OXYCODONE-ACETAMINOPHEN 5-325 MG PO TABS
1.0000 | ORAL_TABLET | Freq: Once | ORAL | Status: AC
  Administered 2014-03-26: 1 via ORAL
  Filled 2014-03-26: qty 1

## 2014-03-26 MED ORDER — OXYCODONE-ACETAMINOPHEN 5-325 MG PO TABS: 1.0000 | ORAL_TABLET | Freq: Four times a day (QID) | ORAL | Status: DC | PRN

## 2014-03-26 NOTE — Discharge Instructions (Signed)
Hematuria Hematuria is blood in your urine. It can be caused by a bladder infection, kidney infection, prostate infection, kidney stone, or cancer of your urinary tract. Infections can usually be treated with medicine, and a kidney stone usually will pass through your urine. If neither of these is the cause of your hematuria, further workup to find out the reason may be needed. It is very important that you tell your health care provider about any blood you see in your urine, even if the blood stops without treatment or happens without causing pain. Blood in your urine that happens and then stops and then happens again can be a symptom of a very serious condition. Also, pain is not a symptom in the initial stages of many urinary cancers. HOME CARE INSTRUCTIONS   Drink lots of fluid, 3-4 quarts a day. If you have been diagnosed with an infection, cranberry juice is especially recommended, in addition to large amounts of water.  Avoid caffeine, tea, and carbonated beverages because they tend to irritate the bladder.  Avoid alcohol because it may irritate the prostate.  Take all medicines as directed by your health care provider.  If you were prescribed an antibiotic medicine, finish it all even if you start to feel better.  If you have been diagnosed with a kidney stone, follow your health care provider's instructions regarding straining your urine to catch the stone.  Empty your bladder often. Avoid holding urine for long periods of time.  After a bowel movement, women should cleanse front to back. Use each tissue only once.  Empty your bladder before and after sexual intercourse if you are a male. SEEK MEDICAL CARE IF:  You develop back pain.  You have a fever.  You have a feeling of sickness in your stomach (nausea) or vomiting.  Your symptoms are not better in 3 days. Return sooner if you are getting worse. SEEK IMMEDIATE MEDICAL CARE IF:   You develop severe vomiting and are  unable to keep the medicine down.  You develop severe back or abdominal pain despite taking your medicines.  You begin passing a large amount of blood or clots in your urine.  You feel extremely weak or faint, or you pass out. MAKE SURE YOU:   Understand these instructions.  Will watch your condition.  Will get help right away if you are not doing well or get worse. Document Released: 06/30/2005 Document Revised: 11/14/2013 Document Reviewed: 02/28/2013 [facility] Patient Information 2015 [place], [place]. This information is not intended to replace advice given to you by your health care provider. Make sure you discuss any questions you have with your health care provider. Varicocele A varicocele is a swelling of veins in the scrotum (the bag of skin that contains the testicles). It is most common in young men. It occurs most often on the left side. Small or painless varicoceles do not need treatment. Most often, this is not a serious problem, but further tests may be needed to confirm the diagnosis. Surgery may be needed if complications of varicoceles arise. Rarely, varicoceles can reoccur after surgery. CAUSES  The swelling is due to blood backing up in the vein that leads from the testicle back to the body. Blood backs up because the valves inside the vein are not working properly. Veins normally return blood to the heart. Valves in veins are supposed to be one-way valves. They should not allow blood to flow backwards. If the valves do not work well, blood can pool in  a vein and make it swell. The same thing happens with varicose veins in the leg. SYMPTOMS  A varicocele most often causes no symptoms. When they occur, symptoms include:   Swelling on one side of the scrotum.  Swelling that is more obvious when standing up.  A lumpy feeling in the scrotum.  Heaviness on one side of the scrotum.  Dull ache in the scrotum, especially after exercise or prolonged standing or  sitting.  Slower growth or reduced size of the testicle on the side of the varicocele (in young males).  Problems with fertility can arise if the testicle does not grow normally. DIAGNOSIS  Varicocele is usually diagnosed by a physical exam. Sometimes ultrasonography is done. TREATMENT  Usually, varicoceles need no treatment. They are often routinely monitored on exam by your caregiver to ensure they do not slow the growth of the testicle on that side. Treatment may be needed if:  The varicocele is large.  There is a lot of pain.  The varicocele causes a decrease in the size of the testicle in a growing adolescent.  The other testicle is absent or not normal.  Varicoceles are found on both sides of the scrotum.  There is pain when exercising.  There are fertility problems. There are two types of treatment:  Surgery. The surgeon ties off the swollen veins. Surgery may be done with an incision in the skin or through a laparoscope. The surgery is usually done in an outpatient setting. Outpatient means there is no overnight stay in a hospital.  Embolization. A small tube is placed in a vein and guided into the swollen veins. X-rays are used to guide the small tube. Tiny metal coils or other blocking items are put through the tube. This blocks swollen veins and the flow of blood. This is usually done in an outpatient setting without the use of general anesthesia. HOME CARE INSTRUCTIONS  To decrease discomfort:  Wear supportive underwear.  Use an athletic supporter for sports.  Only take over-the-counter or prescription medicines for pain or discomfort as directed by your caregiver. SEEK MEDICAL CARE IF:   Pain is increasing.  Swelling does not decrease when lying down.  Testicle is smaller.  The testicle becomes enlarged, swollen, red, or painful. Document Released: 10/06/2000 Document Revised: 09/22/2011 Document Reviewed: 10/10/2009 [facility] Patient Information 2015  [place], [place]. This information is not intended to replace advice given to you by your health care provider. Make sure you discuss any questions you have with your health care provider.

## 2014-03-26 NOTE — ED Provider Notes (Signed)
CSN: [id number]     Arrival date & time 03/26/14  1529 History   First MD Initiated Contact with Patient 03/26/14 1637     Chief Complaint  Patient presents with  . Testicle Pain     (Consider location/radiation/quality/duration/timing/severity/associated sxs/prior Treatment) Patient is a 18 y.o. male presenting with testicular pain. The history is provided by the patient.  Testicle Pain This is a new problem. Pertinent negatives include no chest pain, no abdominal pain, no headaches and no shortness of breath.   patient's had scrotal/testicle pain for last week or 2. [facility] and has been given Rocephin and doxycycline and then on return visit was given Bactrim. He states he has had a couple days of Bactrim now and now has more pain on the left side. No dysuria. No penile discharge. He denies unprotected sex. No fevers. No nausea vomiting. No diarrhea.  Past Medical History  Diagnosis Date  . Irritable bowel syndrome with diarrhea     no current med.  . Pityriasis lichenoides     arms and legs  . 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 No past surgeries    . Ankle arthroscopy with drilling/microfracture Right 12/15/2013    Procedure: RIGHT ANKLE ARTHROSCOPY WITH MICRO FRACTURE ;  Surgeon: Wylene Simmer, MD;  Location: [place];  Service: Orthopedics;  Laterality: Right;   Family History  Problem Relation Age of Onset  . Obesity Father   . ADD / ADHD Brother   . Depression Brother   . Mitral valve prolapse Maternal Grandmother   . Cancer Maternal Grandmother     uterine  . Dementia Maternal Grandfather   . Thyroid disease Maternal Grandfather   . Cancer Paternal Grandfather     renal  . Hypertension Mother    History  Substance Use Topics  . Smoking status: Never Smoker   . Smokeless tobacco: Never Used  . Alcohol Use: No    Review of Systems  Constitutional: Negative  for activity change and appetite change.  Eyes: Negative for pain.  Respiratory: Negative for chest tightness and shortness of breath.   Cardiovascular: Negative for chest pain and leg swelling.  Gastrointestinal: Negative for nausea, vomiting, abdominal pain and diarrhea.  Genitourinary: Positive for testicular pain. Negative for flank pain.  Musculoskeletal: Negative for back pain and neck stiffness.  Skin: Negative for rash.  Neurological: Negative for weakness, numbness and headaches.  Psychiatric/Behavioral: Negative for behavioral problems.      Allergies  Lactose intolerance (gi)  Home Medications   Prior to Admission medications   Medication Sig Start Date End Date Taking? Authorizing Provider  acetaminophen (TYLENOL) 325 MG tablet Take 650 mg by mouth every 6 (six) hours as needed.    Historical Provider, MD  aspirin EC 325 MG tablet Take 1 tablet (325 mg total) by mouth daily. 12/15/13   Wylene Simmer, MD  ibuprofen (ADVIL,MOTRIN) 200 MG tablet Take 4 tablets (800 mg total) by mouth every 8 (eight) hours as needed for mild pain or moderate pain. 12/15/13   Wylene Simmer, MD  oxyCODONE (ROXICODONE) 5 MG immediate release tablet Take 1-2 tablets (5-10 mg total) by mouth every 4 (four) hours as needed for moderate pain or severe pain. 12/15/13   Wylene Simmer, MD  oxyCODONE-acetaminophen (PERCOCET/ROXICET) 5-325 MG per tablet Take 1-2 tablets by mouth every 6 (six) hours as needed for severe pain. 03/26/14  Jasper Riling. Alastor Kneale, MD   BP 121/54  Pulse 100  Temp(Src) 99 F (37.2 C) (Oral)  Resp 15  SpO2 97% Physical Exam  Constitutional: He appears well-developed and well-nourished.  Neck: Neck supple.  Cardiovascular: Normal rate and regular rhythm.   Pulmonary/Chest: Effort normal.  Abdominal: Soft. There is no tenderness.  Genitourinary: Penis normal. No penile tenderness.  Minimal left-sided testicular tenderness. Normal lie. Cremasteric reflex intact. No swelling. No mass  palpated. No hernia palpated  Musculoskeletal: Normal range of motion.  Neurological: He is alert.    ED Course  Procedures (including critical care time) Labs Review Labs Reviewed  URINALYSIS, ROUTINE W REFLEX MICROSCOPIC - Abnormal; Notable for the following:    APPearance CLOUDY (*)    Hgb urine dipstick MODERATE (*)    All other components within normal limits  GC/CHLAMYDIA PROBE AMP  URINE MICROSCOPIC-ADD ON  RPR  HIV ANTIBODY (ROUTINE TESTING)    Imaging Review US Scrotum  03/26/2014   CLINICAL DATA:  Left testicle pain.  EXAM: SCROTAL ULTRASOUND  DOPPLER ULTRASOUND OF THE TESTICLES  TECHNIQUE: Complete ultrasound examination of the testicles, epididymis, and other scrotal structures was performed. Color and spectral Doppler ultrasound were also utilized to evaluate blood flow to the testicles.  COMPARISON:  None.  FINDINGS: Pulsed Doppler interrogation of both testes demonstrates low resistance arterial and venous waveforms bilaterally.  Right testicle  Measurements: 5.0 x 2.2 x 3.0 cm. No mass or microlithiasis visualized.  Left testicle  Measurements: 4.6 x 2.3 x 3.3 cm. No mass or microlithiasis visualized.  Right epididymis:  Normal in size and appearance.  Left epididymis:  Normal in size and appearance.  Hydrocele:  Small bilateral hydroceles.  IMPRESSION: Small bilateral hydroceles. Otherwise normal. Normal perfusion to both testicles.   Electronically Signed   By: Rozetta Nunnery M.D.   On: 03/26/2014 20:12   [place] Art/ven Flow Abd Pelv Doppler  03/26/2014   CLINICAL DATA:  Left testicle pain.  EXAM: SCROTAL ULTRASOUND  DOPPLER ULTRASOUND OF THE TESTICLES  TECHNIQUE: Complete ultrasound examination of the testicles, epididymis, and other scrotal structures was performed. Color and spectral Doppler ultrasound were also utilized to evaluate blood flow to the testicles.  COMPARISON:  None.  FINDINGS: Pulsed Doppler interrogation of both testes demonstrates low resistance arterial and  venous waveforms bilaterally.  Right testicle  Measurements: 5.0 x 2.2 x 3.0 cm. No mass or microlithiasis visualized.  Left testicle  Measurements: 4.6 x 2.3 x 3.3 cm. No mass or microlithiasis visualized.  Right epididymis:  Normal in size and appearance.  Left epididymis:  Normal in size and appearance.  Hydrocele:  Small bilateral hydroceles.  IMPRESSION: Small bilateral hydroceles. Otherwise normal. Normal perfusion to both testicles.   Electronically Signed   By: Rozetta Nunnery M.D.   On: 03/26/2014 20:12     EKG Interpretation None      MDM   Final diagnoses:  Testicular pain, left  Hematuria    Patient with testicular pain and hematuria. Has been seen and treated with antibiotics. Ultrasound shows only hydroceles. Good perfusion. Swabs sent. No other bleeding. Will have followup with urology.    Jasper Riling. Alvino Chapel, MD 03/26/14 2328

## 2014-03-26 NOTE — ED Notes (Signed)
Pt reports having testicle pain that started two weeks ago and went to [facility] one week ago. Pt was given rocephin inj and doxy po, had temp improvement but pain returned so started on bactrim 3 days ago with no relief.

## 2014-03-27 ENCOUNTER — Encounter: Payer: 59 | Admitting: Physical Therapy

## 2014-03-27 LAB — HIV ANTIBODY (ROUTINE TESTING W REFLEX): HIV 1&2 Ab, 4th Generation: NONREACTIVE

## 2014-03-27 LAB — GC/CHLAMYDIA PROBE AMP
CT PROBE, AMP APTIMA: NEGATIVE
GC Probe RNA: NEGATIVE

## 2014-03-27 LAB — RPR

## 2014-04-04 ENCOUNTER — Ambulatory Visit (HOSPITAL_COMMUNITY)
Admission: RE | Admit: 2014-04-04 | Discharge: 2014-04-04 | Disposition: A | Discharge status: Home / Self Care | Payer: 59 | Source: Ambulatory Visit | Attending: Urology | Admitting: Urology

## 2014-04-04 ENCOUNTER — Encounter: Payer: 59 | Admitting: Physical Therapy

## 2014-04-04 ENCOUNTER — Other Ambulatory Visit (HOSPITAL_COMMUNITY): Payer: Self-pay | Admitting: Urology

## 2014-04-04 DIAGNOSIS — R319 Hematuria, unspecified: Secondary | ICD-10-CM | POA: Insufficient documentation

## 2014-04-04 DIAGNOSIS — N200 Calculus of kidney: Secondary | ICD-10-CM

## 2014-04-04 DIAGNOSIS — N509 Disorder of male genital organs, unspecified: Secondary | ICD-10-CM | POA: Insufficient documentation

## 2014-04-11 ENCOUNTER — Ambulatory Visit: Payer: 59 | Admitting: Rehabilitation

## 2014-04-11 DIAGNOSIS — IMO0001 Reserved for inherently not codable concepts without codable children: Secondary | ICD-10-CM | POA: Diagnosis not present

## 2014-04-25 ENCOUNTER — Encounter: Payer: 59 | Admitting: Physical Therapy

## 2014-05-14 DIAGNOSIS — R3129 Other microscopic hematuria: Secondary | ICD-10-CM

## 2014-05-14 HISTORY — DX: Other microscopic hematuria: R31.29

## 2014-05-31 ENCOUNTER — Encounter (HOSPITAL_COMMUNITY): Payer: Self-pay

## 2014-05-31 ENCOUNTER — Emergency Department (INDEPENDENT_AMBULATORY_CARE_PROVIDER_SITE_OTHER)
Admission: EM | Admit: 2014-05-31 | Discharge: 2014-05-31 | Disposition: A | Discharge status: Home / Self Care | Payer: 59 | Source: Home / Self Care | Attending: Family Medicine | Admitting: Family Medicine

## 2014-05-31 DIAGNOSIS — N50819 Testicular pain, unspecified: Secondary | ICD-10-CM

## 2014-05-31 DIAGNOSIS — I889 Nonspecific lymphadenitis, unspecified: Secondary | ICD-10-CM

## 2014-05-31 DIAGNOSIS — N508 Other specified disorders of male genital organs: Secondary | ICD-10-CM

## 2014-05-31 LAB — D-DIMER, QUANTITATIVE (NOT AT ARMC)

## 2014-05-31 LAB — CBC WITH DIFFERENTIAL/PLATELET
BASOS ABS: 0 10*3/uL (ref 0.0–0.1)
BASOS PCT: 0 % (ref 0–1)
Eosinophils Absolute: 0.2 10*3/uL (ref 0.0–0.7)
Eosinophils Relative: 2 % (ref 0–5)
HCT: 43.4 % (ref 39.0–52.0)
Hemoglobin: 15.1 g/dL (ref 13.0–17.0)
Lymphocytes Relative: 20 % (ref 12–46)
Lymphs Abs: 1.9 10*3/uL (ref 0.7–4.0)
MCH: 33 pg (ref 26.0–34.0)
MCHC: 34.8 g/dL (ref 30.0–36.0)
MCV: 94.8 fL (ref 78.0–100.0)
Monocytes Absolute: 0.4 10*3/uL (ref 0.1–1.0)
Monocytes Relative: 5 % (ref 3–12)
NEUTROS ABS: 6.9 10*3/uL (ref 1.7–7.7)
NEUTROS PCT: 73 % (ref 43–77)
PLATELETS: 175 10*3/uL (ref 150–400)
RBC: 4.58 MIL/uL (ref 4.22–5.81)
RDW: 12.1 % (ref 11.5–15.5)
WBC: 9.4 10*3/uL (ref 4.0–10.5)

## 2014-05-31 NOTE — ED Provider Notes (Signed)
CSN: [id number]     Arrival date & time 05/31/14  1225 History   First MD Initiated Contact with Patient 05/31/14 1243     Chief Complaint  Patient presents with  . Abdominal Pain   (Consider location/radiation/quality/duration/timing/severity/associated sxs/prior Treatment) HPI         18 year old male sent for evaluation of testicular pain and a painful lump on his right thigh. This initially began about 2 months ago. He was seen in urgent care twice, first treated with Rocephin and doxycycline, next with Bactrim. He was then seen in the emergency department where he had STD tests and a testicular ultrasound done. All of these are normal and he was referred to urology for follow-up. He has seen urology 4 times and has another appointment set up for next week but they have basically said that they cannot help him, they do not know the cause of his pain. He has also seen a pediatric surgeon and his gastroenterologist. He had a CT scan of the abdomen and pelvis without contrast that was normal. The testicular pain has been constant, only relieved by wearing supportive underwear. They are here today because he has developed this new area of painful swelling on the right superior anterior thigh. He does not know if this is related. The testicular pain has not worsened. No penile discharge. He has had his prostate checked multiple times and it is always normal. No one has done any blood tests apart from the HIV and RPR done in the emergency department.   Denies any systemic symptoms  Past Medical History  Diagnosis Date  . Irritable bowel syndrome with diarrhea     no current med.  . Pityriasis lichenoides     arms and legs  . 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 No past surgeries    . Ankle arthroscopy with drilling/microfracture Right 12/15/2013    Procedure: RIGHT ANKLE ARTHROSCOPY WITH MICRO FRACTURE ;  Surgeon: Wylene Simmer, MD;   Location: [place];  Service: Orthopedics;  Laterality: Right;   Family History  Problem Relation Age of Onset  . Obesity Father   . ADD / ADHD Brother   . Depression Brother   . Mitral valve prolapse Maternal Grandmother   . Cancer Maternal Grandmother     uterine  . Dementia Maternal Grandfather   . Thyroid disease Maternal Grandfather   . Cancer Paternal Grandfather     renal  . Hypertension Mother    History  Substance Use Topics  . Smoking status: Never Smoker   . Smokeless tobacco: Never Used  . Alcohol Use: No    Review of Systems  Constitutional: Negative for fever, chills, fatigue and unexpected weight change.  Genitourinary: Positive for testicular pain. Negative for dysuria, urgency, hematuria, flank pain, decreased urine volume, discharge, penile swelling, scrotal swelling, genital sores and penile pain.  All other systems reviewed and are negative.   Allergies  Lactose intolerance (gi)  Home Medications   Prior to Admission medications   Medication Sig Start Date End Date Taking? Authorizing Provider  acetaminophen (TYLENOL) 325 MG tablet Take 650 mg by mouth every 6 (six) hours as needed.    Historical Provider, MD  aspirin EC 325 MG tablet Take 1 tablet (325 mg total) by mouth daily. 12/15/13   Wylene Simmer, MD  ibuprofen (ADVIL,MOTRIN) 200 MG tablet Take 4 tablets (800 mg total) by mouth every 8 (  eight) hours as needed for mild pain or moderate pain. 12/15/13   Wylene Simmer, MD  oxyCODONE (ROXICODONE) 5 MG immediate release tablet Take 1-2 tablets (5-10 mg total) by mouth every 4 (four) hours as needed for moderate pain or severe pain. 12/15/13   Wylene Simmer, MD  oxyCODONE-acetaminophen (PERCOCET/ROXICET) 5-325 MG per tablet Take 1-2 tablets by mouth every 6 (six) hours as needed for severe pain. 03/26/14   Jasper Riling. Pickering, MD   BP 123/76 mmHg  Pulse 115  Temp(Src) 99.9 F (37.7 C) (Oral)  Resp 16  SpO2 100% Physical Exam  Constitutional:  He is oriented to person, place, and time. He appears well-developed and well-nourished. No distress.  HENT:  Head: Normocephalic.  Pulmonary/Chest: Effort normal. No respiratory distress.  Genitourinary: Penis normal. Right testis shows tenderness. Right testis shows no swelling. Right testis is descended. Cremasteric reflex is not absent on the right side. Left testis shows no swelling and no tenderness. Left testis is descended. Cremasteric reflex is not absent on the left side.  Lymphadenopathy:       Right: Inguinal adenopathy present.       Left: Inguinal adenopathy present.  Right 2cm tender femoral/inguinal LAD  Neurological: He is alert and oriented to person, place, and time. Coordination normal.  Skin: Skin is warm and dry. No rash noted. He is not diaphoretic.  Psychiatric: He has a normal mood and affect. Judgment normal.  Nursing note and vitals reviewed.   ED Course  Procedures (including critical care time) Labs Review Labs Reviewed  CBC WITH DIFFERENTIAL  D-DIMER, QUANTITATIVE  BARTONELLA ANITBODY PANEL    Imaging Review No results found.   MDM   1. Testicular pain   2. Lymphadenitis    Discussed case with the patient's primary care physician, they will see this patient today or tomorrow to order an ultrasound for further evaluation of this probable lymphadenitis. CBC is normal making lymphoma less likely. D-dimer is negative. Bartonella antibody panel sent given regional lymphadenitis and the patient has 2 cats at home that he has been scratched by frequently.  he will follow primary care but otherwise follow-up here as needed. Ibuprofen as needed for pain as of now    Liam Graham, PA-C 05/31/14 1528

## 2014-05-31 NOTE — ED Notes (Signed)
Pain in right groin , developed mass a couple of days ago. Multiple visits for pain in groin at main ED, urology, [id number] . Has another appointment next week

## 2014-05-31 NOTE — Discharge Instructions (Signed)

## 2014-05-31 NOTE — ED Notes (Signed)
Parent will call to schedule an appointment for [place] ASAP

## 2014-05-31 NOTE — Telephone Encounter (Signed)
Completed.

## 2014-06-01 ENCOUNTER — Ambulatory Visit (INDEPENDENT_AMBULATORY_CARE_PROVIDER_SITE_OTHER): Payer: 59 | Admitting: Pediatrics

## 2014-06-01 VITALS — Wt 168.2 lb

## 2014-06-01 DIAGNOSIS — K4191 Unilateral femoral hernia, without obstruction or gangrene, recurrent: Secondary | ICD-10-CM

## 2014-06-01 NOTE — Progress Notes (Signed)
Subjective:     Patient ID: Cody Todd, male   DOB: 1996-02-01, 18 y.o.   MRN: [id number]  HPI Pain started 2 months ago, "an uncomfortable pain," "like my nuts got smushed" Did not seem to let up, then worsened Seen at [facility], including prostate exam, treated for infection (STI, prostatitis) Pain continued and worsened Imaging has ruled out torsion, not identified any masses Urology: "couldn't find anything" CT of abdomen and pelvis, normal study Interstitial cystitis? Treated for this without relief Urology "was not that helpful," "he was really snobby," trial of a spermatic cord block (unsuccessful) Pain seems to have been rising from testicles into groin "Feels like I am being ripped inside," always hurting Only thing that has helped has been more supportive underwear, without support is too painful Question of irritable bowel in the past Seen by Gastroenterologist, did not think it was related to GI Called Dr. Virl Axe, seen and examined, seemed to rule out hernia through exam Has another appointment with Urology sub-specialist next week Over this past weekend felt a lump in middle part of the R inguinal canal This prompted the trip to Urgent Care last night Describes near constant pain, sometimes worse to the point he doubles over  Pain relief: Vicodin (numbing), Ibuprofen, support; though nothing is that helpful  Had surgery on ankle for osteochondral defect Probable irritable bowel Anxiety  Urgent care Urology Gastroenterology General Pediatric Surgery  Review of Systems See HPI    Objective:   Physical Exam  Constitutional: He appears well-developed and well-nourished. No distress.  Abdominal: Soft. Normal appearance and bowel sounds are normal. He exhibits no distension and no mass. There is tenderness in the right lower quadrant. There is no rigidity, no rebound, no guarding and no tenderness at McBurney's point. A hernia is present. Hernia confirmed negative  in the right inguinal area and confirmed negative in the left inguinal area.  Moderate tenderness in R femoral triangle, palpable mass in R femoral triagle  Genitourinary: Testes normal and penis normal. Cremasteric reflex is present. Right testis shows no mass, no swelling and no tenderness. Left testis shows no mass, no swelling and no tenderness. Circumcised. No hypospadias, penile erythema or penile tenderness. No discharge found.  Lymphadenopathy:       Right: No inguinal adenopathy present.       Left: No inguinal adenopathy present.   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    Plan:     1. Referral to general surgery for evaluation and possible management 2. Considering referral to diagnostic dilemma clinic if surgery referral not fruitful 3. Follow-up as needed  Total time = 45 minutes, >50% face to face

## 2014-06-02 NOTE — Addendum Note (Signed)
Addended by: Gari Crown on: 06/02/2014 03:45 PM   Modules accepted: Orders

## 2014-06-03 LAB — BARTONELLA ANTIBODY PANEL: B henselae IgG: POSITIVE

## 2014-06-03 LAB — BARTONELLA ANITBODY PANEL: B HENSELAE IGM: NEGATIVE

## 2014-06-06 ENCOUNTER — Telehealth: Payer: Self-pay

## 2014-06-06 ENCOUNTER — Other Ambulatory Visit: Payer: Self-pay | Admitting: Pediatrics

## 2014-06-06 DIAGNOSIS — K4191 Unilateral femoral hernia, without obstruction or gangrene, recurrent: Secondary | ICD-10-CM

## 2014-06-06 NOTE — Telephone Encounter (Signed)
Will call mother and [facility] with results of [place] when available

## 2014-06-06 NOTE — Telephone Encounter (Signed)
Mom called and would like to talk to you about the referral that was made for [facility].   She talked to someone @ [facility] and was told Cody Todd needed an ultrasound before scheduling an appt.

## 2014-06-06 NOTE — Telephone Encounter (Signed)
[facility] wants an ultrasound and a diagnosis Mass is still present, still uncomfortable, may be getting worse Mass was not present at time of previous studies  Will order [place] and then get in with surgeon

## 2014-06-09 ENCOUNTER — Ambulatory Visit (HOSPITAL_COMMUNITY)
Admission: RE | Admit: 2014-06-09 | Discharge: 2014-06-09 | Disposition: A | Discharge status: Home / Self Care | Payer: 59 | Source: Ambulatory Visit | Attending: Pediatrics | Admitting: Pediatrics

## 2014-06-09 DIAGNOSIS — R229 Localized swelling, mass and lump, unspecified: Secondary | ICD-10-CM | POA: Diagnosis present

## 2014-06-09 DIAGNOSIS — R590 Localized enlarged lymph nodes: Secondary | ICD-10-CM | POA: Diagnosis not present

## 2014-06-09 DIAGNOSIS — K4191 Unilateral femoral hernia, without obstruction or gangrene, recurrent: Secondary | ICD-10-CM

## 2014-06-10 ENCOUNTER — Telehealth: Payer: Self-pay | Admitting: Pediatrics

## 2014-06-10 MED ORDER — OXYCODONE-ACETAMINOPHEN 5-325 MG PO TABS: 1.0000 | ORAL_TABLET | Freq: Four times a day (QID) | ORAL | Status: DC | PRN

## 2014-06-10 NOTE — Telephone Encounter (Signed)
Reported results of [place], found multiple enlarged lymph nodes in R femoral triangle Correlate with exam: fixed, very tender to palpation, rough edges of nodes, larger than typical Plan is to prescribe medication for pain control and seek tissue diagnosis Referral to Surgery is underway, will try to get in more quickly on Monday Wrote prescription for narcotic pain medication and gave to mother  Differential: STI Infection Malignancy  Data to date: Normal CBC (wbc = 9.4) No STI identified (negative for Gonorrhea, Chlamydia, Syphilis, HIV) No testicular mass noted on physical exam Has positive Bartonella henselae IgG, 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 Associated splenomegaly suggests lymphoma, chronic lymphocytic leukemia, acute leukemia, or infectious mononucleosis. All lymph node groups should be examined with the following characteristics in mind: ?Location - Localized lymphadenopathy suggests local causes and should prompt a search for pathology in the area of node drainage, although some systemic diseases such as plague, tularemia, and aggressive lymphomas can present with local adenopathy. Generalized adenopathy is usually a manifestation of systemic disease. ?Size - Abnormal nodes are generally greater than 1 cm in diameter. In one series, no patient with a lymph node smaller than 1 cm2 had cancer, compared with 8 and 38 percent of those with nodes 1 to 2.25 and greater than 2.25 cm2, respectively [43]. The term "shotty" is sometimes used to describe multiple, small nodes, but has no particular diagnostic significance. ?Consistency - Hard nodes are found in cancers that induce fibrosis (scirrhous changes) and when previous  inflammation has left fibrosis. Firm, rubbery nodes are found in lymphomas and chronic leukemia; nodes in acute leukemia tend to be softer. ?Fixation - Normal lymph nodes are freely movable in the subcutaneous space. Abnormal nodes can become fixed to adjacent tissues (eg, deep fascia) by invading cancers or inflammation in tissue surrounding the nodes. They can also become fixed to each other ("matted") by the same processes. ?Tenderness - Tenderness suggests recent, rapid enlargement that has put pain receptors in the capsule under tension. This typically occurs with inflammatory processes, but can also result from hemorrhage into a node, immunologic stimulation, and malignancy.

## 2014-06-13 ENCOUNTER — Other Ambulatory Visit (INDEPENDENT_AMBULATORY_CARE_PROVIDER_SITE_OTHER): Payer: Self-pay | Admitting: General Surgery

## 2014-06-13 ENCOUNTER — Other Ambulatory Visit: Payer: Self-pay | Admitting: Pediatrics

## 2014-06-13 ENCOUNTER — Encounter (HOSPITAL_BASED_OUTPATIENT_CLINIC_OR_DEPARTMENT_OTHER): Payer: Self-pay | Admitting: *Deleted

## 2014-06-13 ENCOUNTER — Telehealth: Payer: Self-pay

## 2014-06-13 DIAGNOSIS — M2550 Pain in unspecified joint: Secondary | ICD-10-CM

## 2014-06-13 DIAGNOSIS — R590 Localized enlarged lymph nodes: Secondary | ICD-10-CM

## 2014-06-13 HISTORY — DX: Pain in unspecified joint: M25.50

## 2014-06-13 HISTORY — DX: Localized enlarged lymph nodes: R59.0

## 2014-06-13 MED ORDER — OXYCODONE-ACETAMINOPHEN 5-325 MG PO TABS: 1.0000 | ORAL_TABLET | Freq: Four times a day (QID) | ORAL | Status: DC | PRN

## 2014-06-13 NOTE — Telephone Encounter (Signed)
Mom called with an update on [place].  The surgeon wants to schedule a biopsy on Cody Todd's lymph node.  Mom will schedule that.  She would like another rx for his  Percocet/Roxicet 5-325mg .

## 2014-06-14 ENCOUNTER — Ambulatory Visit (INDEPENDENT_AMBULATORY_CARE_PROVIDER_SITE_OTHER): Payer: Self-pay | Admitting: Surgery

## 2014-06-14 NOTE — H&P (Signed)
History of Present Illness  Patient words: mass groin.  The patient is a 18 year old male who presents with lymphadenopathy. He reports several months of lower abdominal pain and pain in his R testicle. He has had a ~5lb weight loss as well. Over the last few weeks he has developed severe pain in his medial thigh and groin area. He has had a complete work up for infectious causes and has been treated with several rounds of antibiotics. He has been seen by urology due to some microscopic hematuria. This work up was negative as well. A CT in Aug was normal. An [place] was obtained of his R femoral canal and showed lymphadenopathy with the largest lymh node measuing 2.7 x 1.5 cm. He is referred by Dr Gaynelle Arabian for possible biopsy of this lymph node.    Other Problems Marjean Donna, CMA; 06/13/2014 11:32 AM) Anxiety Disorder  Past Surgical History Marjean Donna, CMA; 06/13/2014 11:32 AM) Foot Surgery Right.  Diagnostic Studies History Marjean Donna, CMA; 06/13/2014 11:32 AM) Colonoscopy 1-5 years ago  Allergies Davy Pique Bynum, CMA; 06/13/2014 11:33 AM) No Known Drug Allergies12/07/2013  Medication History (Sonya Bynum, CMA; 06/13/2014 11:33 AM) Oxycodone-Acetaminophen (5-325MG  Tablet, Oral as needed) Active.  Social History ([place]; 41/03/3789 24:09 AM) Illicit drug use Prefer to discuss with provider.  Family History Marjean Donna, CMA; 06/13/2014 11:32 AM) Arthritis Family Members In General. Breast Cancer Family Members In General. Heart Disease Family Members In General. Melanoma Family Members In General. Migraine Headache Mother. Thyroid problems Family Members In General.  Review of Systems Davy Pique Bynum CMA; 06/13/2014 11:32 AM) General Not Present- Appetite Loss, Chills, Fatigue, Fever, Night Sweats, Weight Gain and Weight Loss. Skin Present- Rash. Not Present- Change in Wart/Mole, Dryness, Hives, Jaundice, New Lesions, Non-Healing Wounds and Ulcer. HEENT Not  Present- Earache, Hearing Loss, Hoarseness, Nose Bleed, Oral Ulcers, Ringing in the Ears, Seasonal Allergies, Sinus Pain, Sore Throat, Visual Disturbances, Wears glasses/contact lenses and Yellow Eyes. Respiratory Present- Wheezing. Not Present- Bloody sputum, Chronic Cough, Difficulty Breathing and Snoring. Breast Not Present- Breast Mass, Breast Pain, Nipple Discharge and Skin Changes. Cardiovascular Present- Rapid Heart Rate and Shortness of Breath. Not Present- Chest Pain, Difficulty Breathing Lying Down, Leg Cramps, Palpitations and Swelling of Extremities. Gastrointestinal Present- Abdominal Pain, Indigestion and Nausea. Not Present- Bloating, Bloody Stool, Change in Bowel Habits, Chronic diarrhea, Constipation, Difficulty Swallowing, Excessive gas, Gets full quickly at meals, Hemorrhoids, Rectal Pain and Vomiting. Male Genitourinary Present- Blood in Urine and Frequency. Not Present- Change in Urinary Stream, Impotence, Nocturia, Painful Urination, Urgency and Urine Leakage. Musculoskeletal Present- Joint Pain, Muscle Pain and Muscle Weakness. Not Present- Back Pain, Joint Stiffness and Swelling of Extremities. Neurological Present- Numbness. Not Present- Decreased Memory, Fainting, Headaches, Seizures, Tingling, Tremor, Trouble walking and Weakness. Psychiatric Present- Anxiety and Change in Sleep Pattern. Not Present- Bipolar, Depression, Fearful and Frequent crying.   Vitals (Sonya Bynum CMA; 06/13/2014 11:33 AM) 06/13/2014 11:32 AM Weight: 170 lb Height: 72in Body Surface Area: 1.98 m Body Mass Index: 23.06 kg/m Temp.: 97.42F(Temporal)  Pulse: 78 (Regular)  BP: 124/74 (Sitting, Left Arm, Standard)    Physical Exam Mental Status-Alert. General Appearance-Consistent with stated age. Hydration-Well hydrated. Voice-Normal.  Head and Neck Head-normocephalic, atraumatic with no lesions or palpable masses. Trachea-midline. Thyroid Gland Characteristics -  normal size and consistency.  Eye Eyeball - Bilateral-Extraocular movements intact. Sclera/Conjunctiva - Bilateral-No scleral icterus.  Chest and Lung Exam Chest and lung exam reveals -quiet, even and easy respiratory effort with no  use of accessory muscles and on auscultation, normal breath sounds, no adventitious sounds and normal vocal resonance. Inspection Chest Wall - Normal. Back - normal.  Cardiovascular Cardiovascular examination reveals -normal heart sounds, regular rate and rhythm with no murmurs and normal pedal pulses bilaterally.  Abdomen Inspection Inspection of the abdomen reveals - No Hernias. Palpation/Percussion Palpation and Percussion of the abdomen reveal - Soft, Non Tender, No Rebound tenderness, No Rigidity (guarding) and No hepatosplenomegaly. Auscultation Auscultation of the abdomen reveals - Bowel sounds normal.  Neurologic Neurologic evaluation reveals -alert and oriented x 3 with no impairment of recent or remote memory. Mental Status-Normal.  Musculoskeletal Global Assessment -Note: no gross deformities.  Normal Exam - Left-Upper Extremity Strength Normal and Lower Extremity Strength Normal. Normal Exam - Right-Upper Extremity Strength Normal and Lower Extremity Strength Normal.  Lymphatic Axillary  General Axillary Region: Bilateral - Description - Normal. Tenderness - Non Tender. Femoral & Inguinal  Generalized Femoral & Inguinal Lymphatics: Bilateral - Description - Localized lymphadenopathy(There is a mass in his medial thigh next to his scrotum that very tender to palpation).    Assessment & Plan  LYMPHADENOPATHY, INGUINAL (785.6  R59.0) Story: Pt with intermiitent lower abd pain and R testicular pain. Has underwent multiple workups which have been nagitive except for an US shows femoral lymphadenopathy with largest measuring 2.7x1.5x2.3cm. Impression: On exam this area is very tender to palpation. I believe it would  be reasonable to obtain a tissue sample. Risks include bleeding, infection and chronic nerve pain. We will try to schedule this as soon as possible, as he is very anxious to get to the bottom of the cause of his symptoms.   Plan right inguinal lymph node biposy.  The surgical procedure has been discussed with the patient.  Potential risks, benefits, alternative treatments, and expected outcomes have been explained.  All of the patient's questions at this time have been answered.  The likelihood of reaching the patient's treatment goal is good.  The patient understand the proposed surgical procedure and wishes to proceed.   Imogene Burn. Georgette Dover, MD, [facility] Surgery  General/ Trauma Surgery  06/14/2014 10:13 AM

## 2014-06-15 ENCOUNTER — Encounter (HOSPITAL_BASED_OUTPATIENT_CLINIC_OR_DEPARTMENT_OTHER): Payer: Self-pay

## 2014-06-15 ENCOUNTER — Encounter (HOSPITAL_BASED_OUTPATIENT_CLINIC_OR_DEPARTMENT_OTHER)
Admission: RE | Disposition: A | Discharge status: Home / Self Care | Payer: Self-pay | Source: Ambulatory Visit | Attending: Surgery

## 2014-06-15 ENCOUNTER — Ambulatory Visit (HOSPITAL_BASED_OUTPATIENT_CLINIC_OR_DEPARTMENT_OTHER): Payer: 59 | Admitting: Anesthesiology

## 2014-06-15 ENCOUNTER — Telehealth: Payer: Self-pay

## 2014-06-15 ENCOUNTER — Ambulatory Visit (HOSPITAL_BASED_OUTPATIENT_CLINIC_OR_DEPARTMENT_OTHER)
Admission: RE | Admit: 2014-06-15 | Discharge: 2014-06-15 | Disposition: A | Discharge status: Home / Self Care | Payer: 59 | Source: Ambulatory Visit | Attending: Surgery | Admitting: Surgery

## 2014-06-15 DIAGNOSIS — I888 Other nonspecific lymphadenitis: Secondary | ICD-10-CM | POA: Insufficient documentation

## 2014-06-15 DIAGNOSIS — R599 Enlarged lymph nodes, unspecified: Secondary | ICD-10-CM | POA: Diagnosis present

## 2014-06-15 DIAGNOSIS — F419 Anxiety disorder, unspecified: Secondary | ICD-10-CM | POA: Insufficient documentation

## 2014-06-15 HISTORY — PX: LYMPH NODE BIOPSY: SHX201

## 2014-06-15 HISTORY — DX: Other microscopic hematuria: R31.29

## 2014-06-15 HISTORY — DX: Localized enlarged lymph nodes: R59.0

## 2014-06-15 HISTORY — DX: Pain in unspecified joint: M25.50

## 2014-06-15 LAB — POCT HEMOGLOBIN-HEMACUE: HEMOGLOBIN: 14.6 g/dL (ref 13.0–17.0)

## 2014-06-15 SURGERY — LYMPH NODE BIOPSY
Anesthesia: General | Site: Groin | Laterality: Right

## 2014-06-15 MED ORDER — OXYCODONE HCL 5 MG PO TABS
5.0000 mg | ORAL_TABLET | Freq: Once | ORAL | Status: AC | PRN
  Administered 2014-06-15: 5 mg via ORAL

## 2014-06-15 MED ORDER — FENTANYL CITRATE 0.05 MG/ML IJ SOLN
INTRAMUSCULAR | Status: DC | PRN
  Administered 2014-06-15: 50 ug via INTRAVENOUS
  Administered 2014-06-15: 25 ug via INTRAVENOUS
  Administered 2014-06-15: 100 ug via INTRAVENOUS

## 2014-06-15 MED ORDER — ONDANSETRON HCL 4 MG/2ML IJ SOLN
INTRAMUSCULAR | Status: DC | PRN
  Administered 2014-06-15: 4 mg via INTRAVENOUS

## 2014-06-15 MED ORDER — HYDROMORPHONE HCL 1 MG/ML IJ SOLN
INTRAMUSCULAR | Status: AC
  Filled 2014-06-15: qty 1

## 2014-06-15 MED ORDER — MIDAZOLAM HCL 2 MG/ML PO SYRP: 12.0000 mg | ORAL_SOLUTION | Freq: Once | ORAL | Status: DC | PRN

## 2014-06-15 MED ORDER — CEFAZOLIN SODIUM-DEXTROSE 2-3 GM-% IV SOLR
2.0000 g | INTRAVENOUS | Status: AC
  Administered 2014-06-15: 2 g via INTRAVENOUS

## 2014-06-15 MED ORDER — PROMETHAZINE HCL 25 MG/ML IJ SOLN: 6.2500 mg | INTRAMUSCULAR | Status: DC | PRN

## 2014-06-15 MED ORDER — OXYCODONE HCL 5 MG PO TABS
ORAL_TABLET | ORAL | Status: AC
Start: 2014-06-15 — End: 2014-06-15
  Filled 2014-06-15: qty 1

## 2014-06-15 MED ORDER — LIDOCAINE HCL (CARDIAC) 20 MG/ML IV SOLN
INTRAVENOUS | Status: DC | PRN
Start: 2014-06-15 — End: 2014-06-15
  Administered 2014-06-15: 50 mg via INTRAVENOUS

## 2014-06-15 MED ORDER — MIDAZOLAM HCL 2 MG/2ML IJ SOLN
INTRAMUSCULAR | Status: AC
  Filled 2014-06-15: qty 2

## 2014-06-15 MED ORDER — PROPOFOL 10 MG/ML IV BOLUS
INTRAVENOUS | Status: DC | PRN
  Administered 2014-06-15: 200 mg via INTRAVENOUS

## 2014-06-15 MED ORDER — OXYCODONE HCL 5 MG/5ML PO SOLN: 5.0000 mg | Freq: Once | ORAL | Status: AC | PRN

## 2014-06-15 MED ORDER — MIDAZOLAM HCL 2 MG/2ML IJ SOLN: 1.0000 mg | INTRAMUSCULAR | Status: DC | PRN

## 2014-06-15 MED ORDER — BUPIVACAINE HCL (PF) 0.5 % IJ SOLN
INTRAMUSCULAR | Status: AC
  Filled 2014-06-15: qty 30

## 2014-06-15 MED ORDER — MIDAZOLAM HCL 5 MG/5ML IJ SOLN
INTRAMUSCULAR | Status: DC | PRN
  Administered 2014-06-15: 2 mg via INTRAVENOUS

## 2014-06-15 MED ORDER — FENTANYL CITRATE 0.05 MG/ML IJ SOLN
INTRAMUSCULAR | Status: AC
  Filled 2014-06-15: qty 4

## 2014-06-15 MED ORDER — DEXAMETHASONE SODIUM PHOSPHATE 4 MG/ML IJ SOLN
INTRAMUSCULAR | Status: DC | PRN
  Administered 2014-06-15: 10 mg via INTRAVENOUS

## 2014-06-15 MED ORDER — OXYCODONE-ACETAMINOPHEN 5-325 MG PO TABS: 1.0000 | ORAL_TABLET | ORAL | Status: DC | PRN

## 2014-06-15 MED ORDER — BUPIVACAINE-EPINEPHRINE 0.25% -1:200000 IJ SOLN
INTRAMUSCULAR | Status: DC | PRN
  Administered 2014-06-15: 10 mL

## 2014-06-15 MED ORDER — CEFAZOLIN SODIUM-DEXTROSE 2-3 GM-% IV SOLR
INTRAVENOUS | Status: AC
  Filled 2014-06-15: qty 50

## 2014-06-15 MED ORDER — HYDROMORPHONE HCL 1 MG/ML IJ SOLN
0.2500 mg | INTRAMUSCULAR | Status: DC | PRN
  Administered 2014-06-15: 0.5 mg via INTRAVENOUS
  Administered 2014-06-15: 0.25 mg via INTRAVENOUS
  Administered 2014-06-15 (×2): 0.5 mg via INTRAVENOUS

## 2014-06-15 MED ORDER — FENTANYL CITRATE 0.05 MG/ML IJ SOLN: 50.0000 ug | INTRAMUSCULAR | Status: DC | PRN

## 2014-06-15 MED ORDER — LACTATED RINGERS IV SOLN
INTRAVENOUS | Status: DC
  Administered 2014-06-15 (×2): via INTRAVENOUS

## 2014-06-15 MED ORDER — ONDANSETRON HCL 4 MG/2ML IJ SOLN: 4.0000 mg | INTRAMUSCULAR | Status: DC | PRN

## 2014-06-15 MED ORDER — MORPHINE SULFATE 2 MG/ML IJ SOLN: 2.0000 mg | INTRAMUSCULAR | Status: DC | PRN

## 2014-06-15 MED ORDER — BUPIVACAINE-EPINEPHRINE (PF) 0.25% -1:200000 IJ SOLN
INTRAMUSCULAR | Status: AC
  Filled 2014-06-15: qty 30

## 2014-06-15 SURGICAL SUPPLY — 47 items
BENZOIN TINCTURE PRP APPL 2/3 (GAUZE/BANDAGES/DRESSINGS) ×3 IMPLANT
BLADE CLIPPER SURG (BLADE) ×3 IMPLANT
BLADE HEX COATED 2.75 (ELECTRODE) ×3 IMPLANT
BLADE SURG 15 STRL LF DISP TIS (BLADE) ×1 IMPLANT
BLADE SURG 15 STRL SS (BLADE) ×2
CANISTER SUCT 1200ML W/VALVE (MISCELLANEOUS) IMPLANT
CHLORAPREP W/TINT 26ML (MISCELLANEOUS) ×3 IMPLANT
CLOSURE WOUND 1/2 X4 (GAUZE/BANDAGES/DRESSINGS) ×1
COVER BACK TABLE 60X90IN (DRAPES) ×3 IMPLANT
COVER MAYO STAND STRL (DRAPES) ×3 IMPLANT
DECANTER SPIKE VIAL GLASS SM (MISCELLANEOUS) IMPLANT
DRAPE LAPAROTOMY TRNSV 102X78 (DRAPE) ×3 IMPLANT
DRAPE UTILITY XL STRL (DRAPES) ×3 IMPLANT
DRSG TEGADERM 4X4.75 (GAUZE/BANDAGES/DRESSINGS) ×3 IMPLANT
ELECT REM PT RETURN 9FT ADLT (ELECTROSURGICAL) ×3
ELECTRODE REM PT RTRN 9FT ADLT (ELECTROSURGICAL) ×1 IMPLANT
GLOVE BIO SURGEON STRL SZ7 (GLOVE) ×6 IMPLANT
GLOVE BIOGEL PI IND STRL 7.0 (GLOVE) ×1 IMPLANT
GLOVE BIOGEL PI IND STRL 7.5 (GLOVE) ×1 IMPLANT
GLOVE BIOGEL PI INDICATOR 7.0 (GLOVE) ×2
GLOVE BIOGEL PI INDICATOR 7.5 (GLOVE) ×2
GLOVE SURG SS PI 7.0 STRL IVOR (GLOVE) ×3 IMPLANT
GOWN STRL REUS W/ TWL LRG LVL3 (GOWN DISPOSABLE) ×3 IMPLANT
GOWN STRL REUS W/TWL LRG LVL3 (GOWN DISPOSABLE) ×6
NEEDLE HYPO 25X1 1.5 SAFETY (NEEDLE) ×3 IMPLANT
NS IRRIG 1000ML POUR BTL (IV SOLUTION) ×3 IMPLANT
PACK BASIN DAY SURGERY FS (CUSTOM PROCEDURE TRAY) ×3 IMPLANT
PAIN PUMP ON-Q 100MLX2ML 2.5IN (PAIN MANAGEMENT) IMPLANT
PENCIL BUTTON HOLSTER BLD 10FT (ELECTRODE) ×3 IMPLANT
SLEEVE SCD COMPRESS KNEE MED (MISCELLANEOUS) ×3 IMPLANT
SPONGE GAUZE 4X4 12PLY STER LF (GAUZE/BANDAGES/DRESSINGS) ×3 IMPLANT
SPONGE INTESTINAL PEANUT (DISPOSABLE) IMPLANT
SPONGE LAP 18X18 X RAY DECT (DISPOSABLE) IMPLANT
SPONGE LAP 4X18 X RAY DECT (DISPOSABLE) ×3 IMPLANT
STRIP CLOSURE SKIN 1/2X4 (GAUZE/BANDAGES/DRESSINGS) ×2 IMPLANT
SUT MON AB 4-0 PC3 18 (SUTURE) ×3 IMPLANT
SUT VIC AB 0 SH 27 (SUTURE) IMPLANT
SUT VIC AB 2-0 SH 27 (SUTURE)
SUT VIC AB 2-0 SH 27XBRD (SUTURE) IMPLANT
SUT VIC AB 3-0 SH 27 (SUTURE) ×2
SUT VIC AB 3-0 SH 27X BRD (SUTURE) ×1 IMPLANT
SYR CONTROL 10ML LL (SYRINGE) ×3 IMPLANT
TOWEL OR 17X24 6PK STRL BLUE (TOWEL DISPOSABLE) ×3 IMPLANT
TOWEL OR NON WOVEN STRL DISP B (DISPOSABLE) ×3 IMPLANT
TUBE CONNECTING 20'X1/4 (TUBING)
TUBE CONNECTING 20X1/4 (TUBING) IMPLANT
YANKAUER SUCT BULB TIP NO VENT (SUCTIONS) IMPLANT

## 2014-06-15 NOTE — Anesthesia Preprocedure Evaluation (Addendum)
Anesthesia Evaluation  Patient identified by MRN, date of birth, ID band Patient awake    Reviewed: Allergy & Precautions, H&P , NPO status , Patient's Chart, lab work & pertinent test results  Airway Mallampati: II  TM Distance: >3 FB Neck ROM: full    Dental  (+) Dental Advidsory Given, Teeth Intact   Pulmonary neg pulmonary ROS,  breath sounds clear to auscultation        Cardiovascular negative cardio ROS  Rhythm:regular Rate:Normal     Neuro/Psych Anxiety negative neurological ROS  negative psych ROS   GI/Hepatic negative GI ROS, Neg liver ROS,   Endo/Other  negative endocrine ROS  Renal/GU negative Renal ROS     Musculoskeletal   Abdominal   Peds  Hematology   Anesthesia Other Findings   Reproductive/Obstetrics negative OB ROS                             Anesthesia Physical Anesthesia Plan  ASA: I  Anesthesia Plan: General LMA   Post-op Pain Management:    Induction:   Airway Management Planned:   Additional Equipment:   Intra-op Plan:   Post-operative Plan:   Informed Consent: I have reviewed the patients History and Physical, chart, labs and discussed the procedure including the risks, benefits and alternatives for the proposed anesthesia with the patient or authorized representative who has indicated his/her understanding and acceptance.   Dental Advisory Given  Plan Discussed with: Anesthesiologist, CRNA and Surgeon  Anesthesia Plan Comments:       Anesthesia Quick Evaluation

## 2014-06-15 NOTE — H&P (View-Only) (Signed)
History of Present Illness  Patient words: mass groin.  The patient is a 18 year old male who presents with lymphadenopathy. He reports several months of lower abdominal pain and pain in his R testicle. He has had a ~5lb weight loss as well. Over the last few weeks he has developed severe pain in his medial thigh and groin area. He has had a complete work up for infectious causes and has been treated with several rounds of antibiotics. He has been seen by urology due to some microscopic hematuria. This work up was negative as well. A CT in Aug was normal. An [place] was obtained of his R femoral canal and showed lymphadenopathy with the largest lymh node measuing 2.7 x 1.5 cm. He is referred by Dr Gaynelle Arabian for possible biopsy of this lymph node.    Other Problems Marjean Donna, CMA; 06/13/2014 11:32 AM) Anxiety Disorder  Past Surgical History Marjean Donna, CMA; 06/13/2014 11:32 AM) Foot Surgery Right.  Diagnostic Studies History Marjean Donna, CMA; 06/13/2014 11:32 AM) Colonoscopy 1-5 years ago  Allergies Davy Pique Bynum, CMA; 06/13/2014 11:33 AM) No Known Drug Allergies12/07/2013  Medication History (Sonya Bynum, CMA; 06/13/2014 11:33 AM) Oxycodone-Acetaminophen (5-325MG  Tablet, Oral as needed) Active.  Social History ([place]; 38/02/8279 03:49 AM) Illicit drug use Prefer to discuss with provider.  Family History Marjean Donna, CMA; 06/13/2014 11:32 AM) Arthritis Family Members In General. Breast Cancer Family Members In General. Heart Disease Family Members In General. Melanoma Family Members In General. Migraine Headache Mother. Thyroid problems Family Members In General.  Review of Systems Davy Pique Bynum CMA; 06/13/2014 11:32 AM) General Not Present- Appetite Loss, Chills, Fatigue, Fever, Night Sweats, Weight Gain and Weight Loss. Skin Present- Rash. Not Present- Change in Wart/Mole, Dryness, Hives, Jaundice, New Lesions, Non-Healing Wounds and Ulcer. HEENT Not  Present- Earache, Hearing Loss, Hoarseness, Nose Bleed, Oral Ulcers, Ringing in the Ears, Seasonal Allergies, Sinus Pain, Sore Throat, Visual Disturbances, Wears glasses/contact lenses and Yellow Eyes. Respiratory Present- Wheezing. Not Present- Bloody sputum, Chronic Cough, Difficulty Breathing and Snoring. Breast Not Present- Breast Mass, Breast Pain, Nipple Discharge and Skin Changes. Cardiovascular Present- Rapid Heart Rate and Shortness of Breath. Not Present- Chest Pain, Difficulty Breathing Lying Down, Leg Cramps, Palpitations and Swelling of Extremities. Gastrointestinal Present- Abdominal Pain, Indigestion and Nausea. Not Present- Bloating, Bloody Stool, Change in Bowel Habits, Chronic diarrhea, Constipation, Difficulty Swallowing, Excessive gas, Gets full quickly at meals, Hemorrhoids, Rectal Pain and Vomiting. Male Genitourinary Present- Blood in Urine and Frequency. Not Present- Change in Urinary Stream, Impotence, Nocturia, Painful Urination, Urgency and Urine Leakage. Musculoskeletal Present- Joint Pain, Muscle Pain and Muscle Weakness. Not Present- Back Pain, Joint Stiffness and Swelling of Extremities. Neurological Present- Numbness. Not Present- Decreased Memory, Fainting, Headaches, Seizures, Tingling, Tremor, Trouble walking and Weakness. Psychiatric Present- Anxiety and Change in Sleep Pattern. Not Present- Bipolar, Depression, Fearful and Frequent crying.   Vitals (Sonya Bynum CMA; 06/13/2014 11:33 AM) 06/13/2014 11:32 AM Weight: 170 lb Height: 72in Body Surface Area: 1.98 m Body Mass Index: 23.06 kg/m Temp.: 97.43F(Temporal)  Pulse: 78 (Regular)  BP: 124/74 (Sitting, Left Arm, Standard)    Physical Exam Mental Status-Alert. General Appearance-Consistent with stated age. Hydration-Well hydrated. Voice-Normal.  Head and Neck Head-normocephalic, atraumatic with no lesions or palpable masses. Trachea-midline. Thyroid Gland Characteristics -  normal size and consistency.  Eye Eyeball - Bilateral-Extraocular movements intact. Sclera/Conjunctiva - Bilateral-No scleral icterus.  Chest and Lung Exam Chest and lung exam reveals -quiet, even and easy respiratory effort with no  use of accessory muscles and on auscultation, normal breath sounds, no adventitious sounds and normal vocal resonance. Inspection Chest Wall - Normal. Back - normal.  Cardiovascular Cardiovascular examination reveals -normal heart sounds, regular rate and rhythm with no murmurs and normal pedal pulses bilaterally.  Abdomen Inspection Inspection of the abdomen reveals - No Hernias. Palpation/Percussion Palpation and Percussion of the abdomen reveal - Soft, Non Tender, No Rebound tenderness, No Rigidity (guarding) and No hepatosplenomegaly. Auscultation Auscultation of the abdomen reveals - Bowel sounds normal.  Neurologic Neurologic evaluation reveals -alert and oriented x 3 with no impairment of recent or remote memory. Mental Status-Normal.  Musculoskeletal Global Assessment -Note: no gross deformities.  Normal Exam - Left-Upper Extremity Strength Normal and Lower Extremity Strength Normal. Normal Exam - Right-Upper Extremity Strength Normal and Lower Extremity Strength Normal.  Lymphatic Axillary  General Axillary Region: Bilateral - Description - Normal. Tenderness - Non Tender. Femoral & Inguinal  Generalized Femoral & Inguinal Lymphatics: Bilateral - Description - Localized lymphadenopathy(There is a mass in his medial thigh next to his scrotum that very tender to palpation).    Assessment & Plan  LYMPHADENOPATHY, INGUINAL (785.6  R59.0) Story: Pt with intermiitent lower abd pain and R testicular pain. Has underwent multiple workups which have been nagitive except for an US shows femoral lymphadenopathy with largest measuring 2.7x1.5x2.3cm. Impression: On exam this area is very tender to palpation. I believe it would  be reasonable to obtain a tissue sample. Risks include bleeding, infection and chronic nerve pain. We will try to schedule this as soon as possible, as he is very anxious to get to the bottom of the cause of his symptoms.   Plan right inguinal lymph node biposy.  The surgical procedure has been discussed with the patient.  Potential risks, benefits, alternative treatments, and expected outcomes have been explained.  All of the patient's questions at this time have been answered.  The likelihood of reaching the patient's treatment goal is good.  The patient understand the proposed surgical procedure and wishes to proceed.   Imogene Burn. Georgette Dover, MD, [facility] Surgery  General/ Trauma Surgery  06/14/2014 10:13 AM

## 2014-06-15 NOTE — Discharge Instructions (Signed)
[place] Office Phone Number [phone] POST OP INSTRUCTIONS  Always review your discharge instruction sheet given to you by the facility where your surgery was performed.  IF YOU HAVE DISABILITY OR FAMILY LEAVE FORMS, YOU MUST BRING THEM TO THE OFFICE FOR PROCESSING.  DO NOT GIVE THEM TO YOUR DOCTOR.  1. A prescription for pain medication may be given to you upon discharge.  Take your pain medication as prescribed, if needed.  If narcotic pain medicine is not needed, then you may take acetaminophen (Tylenol) or ibuprofen (Advil) as needed. 2. Take your usually prescribed medications unless otherwise directed 3. If you need a refill on your pain medication, please contact your pharmacy.  They will contact our office to request authorization.  Prescriptions will not be filled after 5pm or on week-ends. 4. You should eat very light the first 24 hours after surgery, such as soup, crackers, pudding, etc.  Resume your normal diet the day after surgery. 5. Most patients will experience some swelling and bruising around the incision.  Ice packs will help.  Swelling and bruising can take several days to resolve.  6. It is common to experience some constipation if taking pain medication after surgery.  Increasing fluid intake and taking a stool softener will usually help or prevent this problem from occurring.  A mild laxative (Milk of Magnesia or Miralax) should be taken according to package directions if there are no bowel movements after 48 hours. 7. Unless discharge instructions indicate otherwise, you may remove your bandages 48 hours after surgery, and you may shower at that time.  You may have steri-strips (small skin tapes) in place directly over the incision.  These strips should be left on the skin for 7-10 days.  8. ACTIVITIES:  You may resume regular daily activities (gradually increasing) beginning the next day.   You may have sexual intercourse when it is comfortable. a. You  may drive when you no longer are taking prescription pain medication, you can comfortably wear a seatbelt, and you can safely maneuver your car and apply brakes. b. RETURN TO WORK:  ______________________________________________________________________________________ 9. You should see your doctor in the office for a follow-up appointment approximately two weeks after your surgery.  Your doctors nurse will typically make your follow-up appointment when she calls you with your pathology report.  Expect your pathology report 2-3 business days after your surgery.  You may call to check if you do not hear from [place] after three days. 10. OTHER INSTRUCTIONS: _______________________________________________________________________________________________ _____________________________________________________________________________________________________________________________________ _____________________________________________________________________________________________________________________________________ _____________________________________________________________________________________________________________________________________  WHEN TO CALL YOUR DOCTOR: 1. Fever over 101.0 2. Nausea and/or vomiting. 3. Extreme swelling or bruising. 4. Continued bleeding from incision. 5. Increased pain, redness, or drainage from the incision.  The clinic staff is available to answer your questions during regular business hours.  Please dont hesitate to call and ask to speak to one of the nurses for clinical concerns.  If you have a medical emergency, go to the nearest emergency room or call 911.  A surgeon from [facility] is always on call at the hospital.  For further questions, please visit [email or web address]     Post Anesthesia Home Care Instructions  Activity: Get plenty of rest for the remainder of the day. A responsible adult should stay with you for 24 hours following  the procedure.  For the next 24 hours, DO NOT: -Drive a car -Paediatric nurse -Drink alcoholic beverages -Take any medication unless instructed by your physician -Make any legal decisions or sign  important papers.  Meals: Start with liquid foods such as gelatin or soup. Progress to regular foods as tolerated. Avoid greasy, spicy, heavy foods. If nausea and/or vomiting occur, drink only clear liquids until the nausea and/or vomiting subsides. Call your physician if vomiting continues.  Special Instructions/Symptoms: Your throat may feel dry or sore from the anesthesia or the breathing tube placed in your throat during surgery. If this causes discomfort, gargle with warm salt water. The discomfort should disappear within 24 hours.

## 2014-06-15 NOTE — Anesthesia Procedure Notes (Signed)
Procedure Name: LMA Insertion Date/Time: 06/15/2014 7:32 AM Performed by: Lieutenant Diego Pre-anesthesia Checklist: Patient identified, Emergency Drugs available, Suction available and Patient being monitored Patient Re-evaluated:Patient Re-evaluated prior to inductionOxygen Delivery Method: Circle System Utilized Preoxygenation: Pre-oxygenation with 100% oxygen Intubation Type: IV induction Ventilation: Mask ventilation without difficulty LMA: LMA inserted LMA Size: 5.0 Number of attempts: 1 Airway Equipment and Method: bite block Placement Confirmation: positive ETCO2 and breath sounds checked- equal and bilateral Tube secured with: Tape Dental Injury: Teeth and Oropharynx as per pre-operative assessment

## 2014-06-15 NOTE — Interval H&P Note (Signed)
History and Physical Interval Note:  06/15/2014 7:05 AM  Cody Todd  has presented today for surgery, with the diagnosis of lymphadenopathy  The various methods of treatment have been discussed with the patient and family. After consideration of risks, benefits and other options for treatment, the patient has consented to  Procedure(s): RIGHT FEMORAL LYMPH NODE BIOPSY (Right) as a surgical intervention .  The patient's history has been reviewed, patient examined, no change in status, stable for surgery.  I have reviewed the patient's chart and labs.  Questions were answered to the patient's satisfaction.     Kian Ottaviano K.

## 2014-06-15 NOTE — Transfer of Care (Signed)
Immediate Anesthesia Transfer of Care Note  Patient: Cody Todd  Procedure(s) Performed: Procedure(s): RIGHT FEMORAL LYMPH NODE BIOPSY (Right)  Patient Location: PACU  Anesthesia Type:General  Level of Consciousness: sedated  Airway & Oxygen Therapy: Patient Spontanous Breathing and Patient connected to face mask oxygen  Post-op Assessment: Report given to PACU RN and Post -op Vital signs reviewed and stable  Post vital signs: Reviewed and stable  Complications: No apparent anesthesia complications

## 2014-06-15 NOTE — Telephone Encounter (Signed)
Mom called to give an update on [place].  [place] had his biopsy today done by Dr Georgette Dover.  She stated that Dr Georgette Dover removed a "big clump" of lymph nodes. Mom said "like Dr Zenaida Niece described"  Dr Georgette Dover told mom it looked more suspicious for lymphoma than an infection.  The biopsy should be back Monday.

## 2014-06-15 NOTE — Op Note (Signed)
Pre-Op diagnosis - right femoral lymphadenopathy Post-op diagnosis - same Procedure:  Right femoral lymph node biopsy Surgeon:  Topaz Raglin K. Anesthesia:  GEN - LMA Indications:  18 yo male with several months of progressive right groin discomfort, now with palpable lymphadenopathy.  We are asked to perform lymph node biopsy for diagnosis.  Description of procedure:  The patient was brought to the operating room and placed in a supine position on the operating table.  After an adequate level of anesthesia was obtained, his right thigh was prepped with Chloraprep and draped in sterile fashion.  A time out was taken.  We infiltrated with 0.25 % Marcaine with epinephrine.  A longitudinal incision was made and dissection was carried down through the subcutaneous tissues.  Several large firm lymph nodes were palpated.  We excised several large lymph nodes and sent these for pathologic examination.  We irrigated and examined for hemostasis.  The wound was closed with 3-0 Vicryl and subcuticular 4-0 Monocryl.  Steri-strips were applied.  He was extubated and brought to the recovery room in stable condition.  All sponge and instrument counts were correct.  Imogene Burn. Georgette Dover, MD, [facility]  General/ Trauma Surgery  06/15/2014 8:17 AM

## 2014-06-15 NOTE — Anesthesia Postprocedure Evaluation (Signed)
Anesthesia Post Note  Patient: Cody Todd  Procedure(s) Performed: Procedure(s) (LRB): RIGHT FEMORAL LYMPH NODE BIOPSY (Right)  Anesthesia type: general  Patient location: PACU  Post pain: Pain level controlled  Post assessment: Patient's Cardiovascular Status Stable  Last Vitals:  Filed Vitals:   06/15/14 0845  BP: 111/54  Pulse: 64  Temp:   Resp: 14    Post vital signs: Reviewed and stable  Level of consciousness: sedated  Complications: No apparent anesthesia complications

## 2014-06-19 ENCOUNTER — Encounter (HOSPITAL_BASED_OUTPATIENT_CLINIC_OR_DEPARTMENT_OTHER): Payer: Self-pay | Admitting: Surgery

## 2014-06-21 ENCOUNTER — Telehealth: Payer: Self-pay

## 2014-06-21 NOTE — Telephone Encounter (Signed)
Mom called and said she received the results from [name]'s biopsy. No cancer!  She stated the surgeon will contact you and mom would like to talk to you about the next step.

## 2014-06-21 NOTE — Telephone Encounter (Signed)
Left message for mother reviewing work up to date, would recommend that a PPD be placed to rule in or out TB as a cause for non-pulmonary granulomatous lymphadenitis.

## 2014-06-22 ENCOUNTER — Other Ambulatory Visit: Payer: Self-pay | Admitting: Pediatrics

## 2014-06-22 DIAGNOSIS — R591 Generalized enlarged lymph nodes: Secondary | ICD-10-CM

## 2014-06-23 NOTE — Addendum Note (Signed)
Addended by: Gari Crown on: 06/23/2014 11:38 AM   Modules accepted: Orders

## 2014-06-28 ENCOUNTER — Telehealth: Payer: Self-pay | Admitting: Pediatrics

## 2014-06-28 NOTE — Telephone Encounter (Signed)
Mother called stating that Cody Todd has an appointment with Infectious Disease next Wednesday at 8:45 am. Mother has called their office to see if he could be seen sooner and they state if our office calls they could possibly get him in to be seen sooner. Infectious Disease office phone number is [phone].

## 2014-06-30 NOTE — Telephone Encounter (Signed)
Called and discussed situation with nurse for Dr. Lenon Oms to locate any other appointment slot Nurse felt that this physician was a good fit for a younger patient Decided to stay with current appointment at this time Will call back if anything changes

## 2014-07-05 ENCOUNTER — Other Ambulatory Visit (HOSPITAL_COMMUNITY)
Admission: RE | Admit: 2014-07-05 | Discharge: 2014-07-05 | Disposition: A | Discharge status: Home / Self Care | Payer: 59 | Source: Ambulatory Visit | Attending: Infectious Disease | Admitting: Infectious Disease

## 2014-07-05 ENCOUNTER — Encounter: Payer: Self-pay | Admitting: Infectious Disease

## 2014-07-05 ENCOUNTER — Ambulatory Visit (INDEPENDENT_AMBULATORY_CARE_PROVIDER_SITE_OTHER): Payer: 59 | Admitting: Infectious Disease

## 2014-07-05 VITALS — BP 139/94 | HR 94 | Temp 98.4°F | Wt 167.0 lb

## 2014-07-05 DIAGNOSIS — B999 Unspecified infectious disease: Secondary | ICD-10-CM | POA: Insufficient documentation

## 2014-07-05 DIAGNOSIS — R599 Enlarged lymph nodes, unspecified: Secondary | ICD-10-CM

## 2014-07-05 DIAGNOSIS — L980 Pyogenic granuloma: Secondary | ICD-10-CM

## 2014-07-05 DIAGNOSIS — I888 Other nonspecific lymphadenitis: Secondary | ICD-10-CM | POA: Diagnosis not present

## 2014-07-05 DIAGNOSIS — I889 Nonspecific lymphadenitis, unspecified: Secondary | ICD-10-CM

## 2014-07-05 DIAGNOSIS — R590 Localized enlarged lymph nodes: Secondary | ICD-10-CM

## 2014-07-06 LAB — ANGIOTENSIN CONVERTING ENZYME: Angiotensin-Converting Enzyme: 41 U/L (ref 8–52)

## 2014-07-06 LAB — CBC WITH DIFFERENTIAL/PLATELET
BASOS ABS: 0.1 10*3/uL (ref 0.0–0.1)
Basophils Relative: 1 % (ref 0–1)
EOS ABS: 0.2 10*3/uL (ref 0.0–0.7)
Eosinophils Relative: 3 % (ref 0–5)
HCT: 44.9 % (ref 39.0–52.0)
Hemoglobin: 15.8 g/dL (ref 13.0–17.0)
Lymphocytes Relative: 27 % (ref 12–46)
Lymphs Abs: 1.7 10*3/uL (ref 0.7–4.0)
MCH: 32.8 pg (ref 26.0–34.0)
MCHC: 35.2 g/dL (ref 30.0–36.0)
MCV: 93.2 fL (ref 78.0–100.0)
MPV: 10.4 fL (ref 9.4–12.4)
Monocytes Absolute: 0.3 10*3/uL (ref 0.1–1.0)
Monocytes Relative: 5 % (ref 3–12)
Neutro Abs: 4 10*3/uL (ref 1.7–7.7)
Neutrophils Relative %: 64 % (ref 43–77)
PLATELETS: 231 10*3/uL (ref 150–400)
RBC: 4.82 MIL/uL (ref 4.22–5.81)
RDW: 12.8 % (ref 11.5–15.5)
WBC: 6.3 10*3/uL (ref 4.0–10.5)

## 2014-07-06 LAB — COMPLETE METABOLIC PANEL WITH GFR
ALBUMIN: 5.1 g/dL (ref 3.5–5.2)
ALT: 11 U/L (ref 0–53)
AST: 14 U/L (ref 0–37)
Alkaline Phosphatase: 66 U/L (ref 39–117)
BUN: 16 mg/dL (ref 6–23)
CALCIUM: 10 mg/dL (ref 8.4–10.5)
CO2: 27 meq/L (ref 19–32)
Chloride: 104 mEq/L (ref 96–112)
Creat: 0.82 mg/dL (ref 0.50–1.35)
GLUCOSE: 101 mg/dL — AB (ref 70–99)
POTASSIUM: 4.1 meq/L (ref 3.5–5.3)
SODIUM: 141 meq/L (ref 135–145)
TOTAL PROTEIN: 8.1 g/dL (ref 6.0–8.3)
Total Bilirubin: 1.3 mg/dL — ABNORMAL HIGH (ref 0.2–1.2)

## 2014-07-06 LAB — C-REACTIVE PROTEIN

## 2014-07-06 LAB — SEDIMENTATION RATE: Sed Rate: 1 mm/hr (ref 0–16)

## 2014-07-06 NOTE — Progress Notes (Signed)
Subjective:    Patient ID: Cody Todd, male    DOB: 1996-07-10, 18 y.o.   MRN: [id number]  HPI  18 year old previously healthy with exception of chronic pityriasis lichenoides whose family adopted 2 kittens this fall. He plays with kittens frequently and is typically scratched by them on his arms and legs. In September he had developed severe bilateral testicular pain. He was seen at urgent care [facility] and given IM rocephin and Doxycycline for possible GC and chlamydia. Symptoms continued and he was started on oral bactrim and he came to ED at [facility] where testing showed:  GC, chlamydia were negative as was HIV antibody and RPR. US showed only hydroceles at that time.  CT abdomen on 04/04/14 was normal  By November he had also developed PAINFUL massive right sided inguinal lymphadenopathy.  [place] indeed showed Multiple prominent lymph nodes right groin. The largest measures 2.7 x 1.5 x 2.3 cm.  He was seen by Dr Zenaida Niece after having been seen by Urology mx times and GI for evaluation of possible hernia  He ultimately underwent excisional LN biopsy by Dr. Georgette Dover of right femoral LN.   Pathology showed NECROTIZING GRANULOMATOUS INFLAMMATION.   STains for AFB, fungi were negative. Unfortunately NO specimens were sent for AFB fungal or bacterial cultures.  He had Bartonella antibodies done and IgG were positive (though I cannot retrieve the titers)  After surgery he had some relief in his lymphadenopathy but this has recurred along with fluid that was aspirated by Dr Georgette Dover. He has been referred to [place] in ID.  ON review of risk factors, patient with no hx of exposure to anyone with TB. Travel history isolated to [place], [place].      Review of Systems  Constitutional: Negative for fever, chills, diaphoresis, activity change, appetite change, fatigue and unexpected weight change.  HENT: Negative for congestion, rhinorrhea, sinus pressure, sneezing, sore throat and  trouble swallowing.   Eyes: Negative for photophobia and visual disturbance.  Respiratory: Negative for cough, chest tightness, shortness of breath, wheezing and stridor.   Cardiovascular: Negative for chest pain, palpitations and leg swelling.  Gastrointestinal: Negative for nausea, vomiting, abdominal pain, diarrhea, constipation, blood in stool, abdominal distention and anal bleeding.  Genitourinary: Positive for scrotal swelling and testicular pain. Negative for dysuria, urgency, frequency, hematuria, flank pain, decreased urine volume, discharge, difficulty urinating, genital sores and penile pain.  Musculoskeletal: Negative for myalgias, back pain, joint swelling, arthralgias and gait problem.  Skin: Negative for color change, pallor, rash and wound.  Neurological: Negative for dizziness, tremors, weakness and light-headedness.  Hematological: Positive for adenopathy. Does not bruise/bleed easily.  Psychiatric/Behavioral: Negative for behavioral problems, confusion, sleep disturbance, dysphoric mood, decreased concentration and agitation.       Objective:   Physical Exam  Constitutional: He is oriented to person, place, and time. He appears well-developed and well-nourished.  HENT:  Head: Normocephalic and atraumatic.  Eyes: Conjunctivae and EOM are normal.  Neck: Normal range of motion. Neck supple.  Cardiovascular: Normal rate and regular rhythm.   Pulmonary/Chest: Effort normal. No respiratory distress. He has no wheezes.  Abdominal: Soft. He exhibits no distension.  Genitourinary: Penis normal. Right testis shows tenderness. Left testis shows tenderness. No penile erythema. No discharge found.  Musculoskeletal: Normal range of motion. He exhibits no edema or tenderness.  Lymphadenopathy:       Right: Inguinal adenopathy present.       Left: Inguinal adenopathy present.  Neurological: He  is alert and oriented to person, place, and time.  Skin: Skin is warm and dry. No rash  noted. No erythema. No pallor.   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 I agree infectious etiology is highly likely.   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 In particular the persistence of the lymphadenopathy x nearly 4 mnths irrespective of antibiotic therapy including recurrence of LN enlargement post surgery is consistent with what I have personally witnessed with a past case of patient with axillary CSD and what is written in the literature. The scrotal pain is unusual but may represent pathology due to obstructed lymphatics.  Certainly TB lymphadenitis is possible as is GU disease due to MTB  Simlarly M avium is  In the differential along with other NTM  Fungi such as cryptococcus, histoplasmosis given travel to [place], blastomyces are all possible  Toxo possible. Syphilis negative. HIV negative. Path NOT c/w EBV or CMV pathology  We will check repeat Bartonella titers, QF gold, crypto ag serum urine histo, blastomyces antigens, urine AFB culture, Toxoplasma serologies.   I have submitted request to pathology to [place] to [facility] TO DO RIBOSOMAL 16 S SEQUENCING FOR TB, NTM, FUNGI, BACTERIA INCLUDING BARTONELLA (SPECIFICALLY REQUESTED) TOXOPLASMA.  Due to the Holidays I have been told to expect likely 2 week turn around  IF THIS RIBOSOMAL SEQUENCING IS UNHELPFUL THEN I WOULD RECOMMEND HE HAVE REPEAT INGUINAL LYMPH NODE RESECTION (RIGHT SIDE LIKELY STILL A GOOD TARGET) WITH SPECIMEN SENT FOR   AFB STAIN AND CULTURE  FUNGAL STAIN AND CULTURE  CULTURES FOR BARTONELLA (FASTIDIOUS ORGANISM CULTURE)  BACTERIA CULture  PCR for BARTONELLA  PCR for MTB  PATHOLOGY SMEARS  WITH WHARTHIN STARRY STAIN FOR BARTONELLA  I spent greater than 60 minutes with the patient including greater than 50% of time in face to face counsel of the patient and in coordination of their care.

## 2014-07-08 LAB — QUANTIFERON TB GOLD ASSAY (BLOOD)
INTERFERON GAMMA RELEASE ASSAY: NEGATIVE
Mitogen value: >10 IU/mL
Quantiferon Nil Value: 0.02 IU/mL
Quantiferon Tb Ag Minus Nil Value: 0 IU/mL
TB Ag value: 0.02 IU/mL

## 2014-07-08 LAB — CRYPTOCOCCAL AG, LTX SCR RFLX TITER: CRYPTOCOCCAL AG SCREEN: NOT DETECTED

## 2014-07-10 LAB — BRUCELLA IGG, IGM
BRUCELLA IGG: 0.24
Brucella, IgM: 0.04

## 2014-07-11 LAB — TOXOPLASMA ANTIBODIES- IGG AND  IGM: Toxoplasma IgG Ratio: <3 IU/mL (ref <7.2)

## 2014-07-12 ENCOUNTER — Ambulatory Visit (INDEPENDENT_AMBULATORY_CARE_PROVIDER_SITE_OTHER): Payer: 59 | Admitting: Pediatrics

## 2014-07-12 DIAGNOSIS — Z23 Encounter for immunization: Secondary | ICD-10-CM

## 2014-07-12 LAB — BARTONELLA ANITBODY PANEL: B HENSELAE IGM: NEGATIVE

## 2014-07-12 LAB — BARTONELLA ANTIBODY PANEL: B henselae IgG: POSITIVE — AB

## 2014-07-12 LAB — RFLX B. HENSELAE IGG TITER: B. Henselae IgG Titer: 1:512 {titer} — ABNORMAL HIGH

## 2014-07-12 NOTE — Progress Notes (Signed)
Presented today for flu vaccine. No new questions on vaccine. Parent was counseled on risks benefits of vaccine and parent verbalized understanding. Handout (VIS) given for each vaccine. 

## 2014-07-13 ENCOUNTER — Telehealth: Payer: Self-pay | Admitting: Infectious Disease

## 2014-07-13 ENCOUNTER — Encounter: Payer: Self-pay | Admitting: Infectious Disease

## 2014-07-13 ENCOUNTER — Encounter (HOSPITAL_COMMUNITY): Payer: Self-pay

## 2014-07-13 DIAGNOSIS — R760 Raised antibody titer: Secondary | ICD-10-CM | POA: Insufficient documentation

## 2014-07-13 DIAGNOSIS — A281 Cat-scratch disease: Secondary | ICD-10-CM | POA: Insufficient documentation

## 2014-07-13 MED ORDER — AZITHROMYCIN 250 MG PO TABS: 250.0000 mg | ORAL_TABLET | ORAL | Status: DC

## 2014-07-13 NOTE — Telephone Encounter (Signed)
Patients lymph node tissue came back from the [facility] [place] in [place] and is indeed positive for Bartonella Henselae.  I spoke with pt and mother and told them of results.   I will call in a "z-pack" to see if this helps but I suspect he will need more aspirations and unfortunately more time for this to get better. If no significant improvement post azitho pt should see Dr. Georgette Dover for aspiration of lymph node as this is often one of the best ways to improve symptoms from this condition.

## 2014-07-19 ENCOUNTER — Telehealth: Payer: Self-pay

## 2014-07-19 ENCOUNTER — Telehealth: Payer: Self-pay | Admitting: *Deleted

## 2014-07-19 ENCOUNTER — Encounter (HOSPITAL_COMMUNITY): Payer: Self-pay | Admitting: Emergency Medicine

## 2014-07-19 ENCOUNTER — Emergency Department (HOSPITAL_COMMUNITY)
Admission: EM | Admit: 2014-07-19 | Discharge: 2014-07-19 | Disposition: A | Discharge status: Home / Self Care | Payer: 59 | Source: Home / Self Care | Attending: Family Medicine | Admitting: Family Medicine

## 2014-07-19 DIAGNOSIS — S80862A Insect bite (nonvenomous), left lower leg, initial encounter: Secondary | ICD-10-CM

## 2014-07-19 DIAGNOSIS — W57XXXA Bitten or stung by nonvenomous insect and other nonvenomous arthropods, initial encounter: Secondary | ICD-10-CM

## 2014-07-19 MED ORDER — FLUTICASONE PROPIONATE 0.05 % EX CREA: TOPICAL_CREAM | Freq: Two times a day (BID) | CUTANEOUS | Status: DC

## 2014-07-19 NOTE — Telephone Encounter (Signed)
As we discussed patient should be fine to see Dr Megan Salon in clinic tomorrow.   Has been recently confirmed to have Cat Scatch Disease with inguinal LA and given zpack by me again on New years eve

## 2014-07-19 NOTE — ED Notes (Signed)
Pt states that he believes he was bite by a spider on the left outer side of his ankle

## 2014-07-19 NOTE — Discharge Instructions (Signed)
Use medicine as needed, see your doctor as needed.

## 2014-07-19 NOTE — Telephone Encounter (Signed)
Mother of patient is calling with c/o red circular area on left ankle for 2-3 hours with yellow/blisters in center that burn.  She wonders if this is related to his diagnosis of Cat scratch disease.  Patient denies chills/fever, numbness of toes or foot. No swelling present. No other areas of leg or foot are affected.  Advised office visit tomorrow for evaluation.  Mother is not sure if she should wait overnight.  I spoke with Dr Tommy Medal who says it is okay to wait for evaluation on tomorrow. Mother advised if symptoms worsen it is okay to take [facility] to ED for evaluation.   Laverle Patter, RN

## 2014-07-19 NOTE — ED Provider Notes (Signed)
CSN: [id number]     Arrival date & time 07/19/14  1910 History   First MD Initiated Contact with Patient 07/19/14 1917     Chief Complaint  Patient presents with  . Insect Bite   (Consider location/radiation/quality/duration/timing/severity/associated sxs/prior Treatment) Patient is a 19 y.o. male presenting with rash. The history is provided by the patient.  Rash Location:  Leg Leg rash location:  L ankle Quality: blistering   Severity:  Mild Onset quality:  Sudden Duration:  3 hours Progression:  Unchanged Chronicity:  New Context comment:  Went to sleep today and awoke with blister on left ankle. Relieved by:  None tried Worsened by:  Nothing tried Ineffective treatments:  None tried   Past Medical History  Diagnosis Date  . Irritable bowel syndrome with diarrhea     no current med.  . Pityriasis lichenoides     arms and legs  . Anxiety   . Lymphadenopathy, inguinal 06/2014    right  . Hematuria, microscopic 05/2014  . Joint pain 06/13/2014    knees and legs  . Cat-scratch disease   . High serum Bartonella henselae antibody titer    Past Surgical History  Procedure Laterality Date  . Ankle arthroscopy with drilling/microfracture Right 12/15/2013    Procedure: RIGHT ANKLE ARTHROSCOPY WITH MICRO FRACTURE ;  Surgeon: Wylene Simmer, MD;  Location: [place];  Service: Orthopedics;  Laterality: Right;  . Lymph node biopsy Right 06/15/2014    Procedure: RIGHT FEMORAL LYMPH NODE BIOPSY;  Surgeon: Donnie Mesa, MD;  Location: [place];  Service: General;  Laterality: Right;   Family History  Problem Relation Age of Onset  . Obesity Father   . ADD / ADHD Brother   . Depression Brother   . Mitral valve prolapse Maternal Grandmother   . Cancer Maternal Grandmother     uterine  . Dementia Maternal Grandfather   . Thyroid disease Maternal Grandfather   . Cancer Paternal Grandfather     renal  . Hypertension Mother    History  Substance Use  Topics  . Smoking status: Smoker, Current Status Unknown  . Smokeless tobacco: Never Used     Comment: mother thinks he has not smoked in a few months  . Alcohol Use: No    Review of Systems  Constitutional: Negative.   Skin: Positive for rash.    Allergies  Lactose intolerance (gi)  Home Medications   Prior to Admission medications   Medication Sig Start Date End Date Taking? Authorizing Provider  azithromycin (ZITHROMAX Z-PAK) 250 MG tablet Take 1 tablet (250 mg total) by mouth as directed. Take 2 tablets day one then one tablet daily for 4 days 07/13/14   Truman Hayward, MD  fluticasone (CUTIVATE) 0.05 % cream Apply topically 2 (two) times daily. 07/19/14   Billy Fischer, MD  oxyCODONE-acetaminophen (PERCOCET/ROXICET) 5-325 MG per tablet Take 1-2 tablets by mouth every 6 (six) hours as needed for severe pain. Patient not taking: Reported on 07/05/2014 06/13/14   Maurice March, MD  oxyCODONE-acetaminophen (PERCOCET/ROXICET) 5-325 MG per tablet Take 1 tablet by mouth every 4 (four) hours as needed for severe pain. Patient not taking: Reported on 07/05/2014 06/15/14   Donnie Mesa, MD   BP 138/88 mmHg  Pulse 126  Temp(Src) 100.1 F (37.8 C) (Oral)  Resp 20  SpO2 96% Physical Exam  Constitutional: He is oriented to person, place, and time. He appears well-developed and well-nourished.  Musculoskeletal: He exhibits no tenderness.  Neurological: He is alert and oriented to person, place, and time.  Skin: Skin is warm and dry. Rash noted.  1cm clear circ blister lesion above left lat malleolus. No purulent fluid. V min local erythema.  Nursing note and vitals reviewed.   ED Course  Procedures (including critical care time) Labs Review Labs Reviewed - No data to display  Imaging Review No results found.   MDM   1. Insect bite of leg, left, initial encounter        Billy Fischer, MD 07/19/14 [phone]

## 2014-07-19 NOTE — Telephone Encounter (Signed)
No recent outdoor activity according to his mother.  Has only been "laying around the house."

## 2014-07-20 ENCOUNTER — Ambulatory Visit: Payer: 59 | Admitting: Internal Medicine

## 2014-08-01 ENCOUNTER — Ambulatory Visit (INDEPENDENT_AMBULATORY_CARE_PROVIDER_SITE_OTHER): Payer: 59 | Admitting: Infectious Disease

## 2014-08-01 ENCOUNTER — Encounter: Payer: Self-pay | Admitting: Infectious Disease

## 2014-08-01 VITALS — BP 135/76 | HR 101 | Temp 98.1°F | Wt 165.5 lb

## 2014-08-01 DIAGNOSIS — N50819 Testicular pain, unspecified: Secondary | ICD-10-CM | POA: Insufficient documentation

## 2014-08-01 DIAGNOSIS — N509 Disorder of male genital organs, unspecified: Secondary | ICD-10-CM | POA: Insufficient documentation

## 2014-08-01 DIAGNOSIS — A449 Bartonellosis, unspecified: Secondary | ICD-10-CM

## 2014-08-01 DIAGNOSIS — L98499 Non-pressure chronic ulcer of skin of other sites with unspecified severity: Secondary | ICD-10-CM | POA: Insufficient documentation

## 2014-08-01 MED ORDER — CIPROFLOXACIN HCL 500 MG PO TABS: 500.0000 mg | ORAL_TABLET | Freq: Two times a day (BID) | ORAL | Status: DC

## 2014-08-01 NOTE — Progress Notes (Signed)
HPI    Subjective:    Patient ID: Cody Todd, male    DOB: 1996-06-18, 19 y.o.   MRN: [id number]  HPI  19 year old previously healthy with exception of chronic pityriasis lichenoides whose family adopted 2 kittens this fall. He plays with kittens frequently and is typically scratched by them on his arms and legs. In September he had developed severe bilateral testicular pain. He was seen at urgent care [facility] and given IM rocephin and Doxycycline for possible GC and chlamydia. Symptoms continued and he was started on oral bactrim and he came to ED at [facility] where testing showed:  GC, chlamydia were negative as was HIV antibody and RPR. US showed only hydroceles at that time.  CT abdomen on 04/04/14 was normal  By November he had also developed PAINFUL massive right sided inguinal lymphadenopathy.  [place] indeed showed Multiple prominent lymph nodes right groin. The largest measures 2.7 x 1.5 x 2.3 cm.  He was seen by Dr Zenaida Niece after having been seen by Urology mx times and GI for evaluation of possible hernia  He ultimately underwent excisional LN biopsy by Dr. Georgette Dover of right femoral LN.   Pathology showed NECROTIZING GRANULOMATOUS INFLAMMATION.   STains for AFB, fungi were negative. Unfortunately NO specimens were sent for AFB fungal or bacterial cultures.  We sent tissue from the Lymph node to [facility] in [facility] for TB, bacteria, fungi, Bartonella, Toxoplasma and the Bartonella PCR came back POSITIVE. I also rechecked serologies for CSD and Bartonella IgG were much higher than in November at 1:512.   We gave him a course of azithromycin and he has had in the interim nice improvement in his inguinal LA in particular at site with Dr. Georgette Dover excised the Lymph node. In the interim he developed a lesion on his distal left leg that he thought was in insect bite in which looked like raised yellowish blistering lesion. He ultimately went to ED where he was examined, blister was popped open and he  was given topical steroid that he has been using. Lesion is scabbed but not yet healed.  He continues to have excrutiating scrotal , testicular pain that has not resolved  He is without fevers, chills, malaise.      Review of Systems  Constitutional: Negative for fever, chills, diaphoresis, activity change, appetite change, fatigue and unexpected weight change.  HENT: Negative for congestion, rhinorrhea, sinus pressure, sneezing, sore throat and trouble swallowing.   Eyes: Negative for photophobia and visual disturbance.  Respiratory: Negative for cough, chest tightness, shortness of breath, wheezing and stridor.   Cardiovascular: Negative for chest pain, palpitations and leg swelling.  Gastrointestinal: Negative for nausea, vomiting, abdominal pain, diarrhea, constipation, blood in stool, abdominal distention and anal bleeding.  Genitourinary: Positive for scrotal swelling and testicular pain. Negative for dysuria, urgency, frequency, hematuria, flank pain, decreased urine volume, discharge, difficulty urinating, genital sores and penile pain.  Musculoskeletal: Negative for myalgias, back pain, joint swelling, arthralgias and gait problem.  Skin: Negative for color change, pallor, rash and wound.  Neurological: Negative for dizziness, tremors, weakness and light-headedness.  Hematological: Positive for adenopathy. Does not bruise/bleed easily.  Psychiatric/Behavioral: Negative for behavioral problems, confusion, sleep disturbance, dysphoric mood, decreased concentration and agitation.       Objective:   Physical Exam  Constitutional: He is oriented to person, place, and time. He appears well-developed and well-nourished.  HENT:  Head: Normocephalic and atraumatic.  Eyes: Conjunctivae and EOM are normal.  Neck: Normal range of  motion. Neck supple.  Cardiovascular: Normal rate and regular rhythm.   Pulmonary/Chest: Effort normal. No respiratory distress. He has no wheezes.    Abdominal: Soft. He exhibits no distension.  Genitourinary: Penis normal. Right testis shows tenderness. Left testis shows tenderness. No penile erythema. No discharge found.  Musculoskeletal: Normal range of motion. He exhibits no edema or tenderness.  Lymphadenopathy:       Right: Inguinal adenopathy present.       Left: Inguinal adenopathy present.  Neurological: He is alert and oriented to person, place, and time.  Skin: Skin is warm and dry. No rash noted. No erythema. No pallor.   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 I agree infectious etiology is highly likely.   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 In particular the persistence of the lymphadenopathy x nearly 4 mnths irrespective of antibiotic therapy including recurrence of LN enlargement post surgery is consistent with what I have personally witnessed with a past case of patient with axillary CSD and what is written in the literature. The scrotal pain is unusual but may represent pathology due to obstructed lymphatics.  Certainly TB lymphadenitis is possible as is GU disease due to MTB  Simlarly M avium is  In the differential along with other NTM  Fungi such as cryptococcus, histoplasmosis given travel to [place], blastomyces are all possible  Toxo possible. Syphilis negative. HIV negative. Path NOT c/w EBV or CMV pathology  We will check repeat Bartonella titers, QF gold, crypto ag serum urine histo, blastomyces antigens, urine AFB culture, Toxoplasma serologies.   I have submitted request to pathology to [place] [facility] TO DO RIBOSOMAL 16 S SEQUENCING FOR TB, NTM, FUNGI, BACTERIA INCLUDING BARTONELLA  (SPECIFICALLY REQUESTED) TOXOPLASMA.  Due to the Holidays I have been told to expect likely 2 week turn around  IF THIS RIBOSOMAL SEQUENCING IS UNHELPFUL THEN I WOULD RECOMMEND HE HAVE REPEAT INGUINAL LYMPH NODE RESECTION (RIGHT SIDE LIKELY STILL A GOOD TARGET) WITH SPECIMEN SENT FOR   AFB STAIN AND CULTURE  FUNGAL STAIN AND CULTURE  CULTURES FOR BARTONELLA (FASTIDIOUS ORGANISM CULTURE)  BACTERIA CULture  PCR for BARTONELLA  PCR for MTB  PATHOLOGY SMEARS WITH WHARTHIN STARRY STAIN FOR BARTONELLA  I spent greater than 60 minutes with the patient including greater than 50% of time in face to face counsel of the patient and in coordination of their care.       Review of Systems  Constitutional: Negative for fever, chills, diaphoresis, activity change, appetite change, fatigue and unexpected weight change.  HENT: Negative for congestion, rhinorrhea, sinus pressure, sneezing, sore throat and trouble swallowing.   Eyes: Negative for photophobia and visual disturbance.  Respiratory: Negative for cough, chest tightness, shortness of breath, wheezing and stridor.   Cardiovascular: Negative for chest pain, palpitations and leg swelling.  Gastrointestinal: Negative for nausea, vomiting, abdominal pain, diarrhea, constipation, blood in stool, abdominal distention and anal bleeding.  Genitourinary: Positive for testicular pain. Negative for dysuria, hematuria, flank pain and difficulty urinating.  Musculoskeletal: Negative for myalgias, back pain, joint swelling, arthralgias and gait problem.  Skin: Positive for rash. Negative for color change, pallor and wound.  Neurological: Negative for dizziness,  tremors, weakness and light-headedness.  Hematological: Positive for adenopathy. Does not bruise/bleed easily.  Psychiatric/Behavioral: Negative for behavioral problems, confusion, sleep disturbance, dysphoric mood, decreased concentration and agitation.       Objective:   Physical Exam    Constitutional: He is oriented to person, place, and time. He appears well-developed and well-nourished.  HENT:  Head: Normocephalic and atraumatic.  Eyes: Conjunctivae and EOM are normal.  Neck: Normal range of motion. Neck supple.  Cardiovascular: Normal rate and regular rhythm.   Pulmonary/Chest: Effort normal. No respiratory distress. He has no wheezes.  Abdominal: Soft. He exhibits no distension.  Genitourinary: Right testis shows tenderness. Right testis shows no mass and no swelling. Left testis shows tenderness. Left testis shows no mass and no swelling. Circumcised. No phimosis.  Musculoskeletal: Normal range of motion. He exhibits no edema.  Lymphadenopathy:       Right: Inguinal adenopathy present.       Left: Inguinal adenopathy present.  Neurological: He is alert and oriented to person, place, and time.  Skin: Skin is warm and dry.  Psychiatric: He has a normal mood and affect. His behavior is normal. Judgment and thought content normal.   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 Sp doxy this fall (when being rx for presumed STDs) and now azithromycin  His LA is actually improving now but his scrotal pain, testicular pain persists.  I suspect that the testicular and scrotal pain which was FIRST symptom is due to lymphatic blockage and irritation that communicates into scrotum. I CANNOT find literature of case reports of epididymis with Bartonella and I suspect this pain in scrotum and testicle is due to residual inflammation that will take time to resolve.   Given his persistent suffering I am willing to give trial of CIPRO 500mg  BID for 2-4 weeks and even consider adding rifampin  I have infomed pt and mother that the  literature re CSD does NOT support benefit from antibiotics beyond what has already been done but I am willing to try this to see if it may help and he and she accept risks involved with abx  #2 Scrotal pain, testicular pain: see above re cipro. Would also have him go ahead and see Urologist from [facility] who practices here in [place] as well. Perhaps this is a postinfectious syndrome or just prolonged time for all of this lymphatics to calm down  I spent greater than 40 minutes with the patient including greater than 50% of time in face to face counsel of the patient and in coordination of their care.

## 2014-08-01 NOTE — Patient Instructions (Signed)
Ok to overbook.

## 2014-08-02 ENCOUNTER — Ambulatory Visit: Payer: 59 | Admitting: Infectious Disease

## 2014-08-07 ENCOUNTER — Telehealth: Payer: Self-pay | Admitting: Pediatrics

## 2014-08-07 NOTE — Telephone Encounter (Signed)
Mom wanted you to know that Toni ended up with cat scratch fever

## 2014-08-10 ENCOUNTER — Telehealth: Payer: Self-pay | Admitting: Licensed Clinical Social Worker

## 2014-08-10 NOTE — Telephone Encounter (Signed)
Patient's mother called stating that the patient went to the oral surgeon and was given penicillin 500 qid for 7 days for a possible tooth abscess. Mother was concerned that since he is already on Cipro 500 mg that that would be too much for him considering he has IBS. Per Dr. Tommy Medal Cipro does not cover tooth bacteria so it would be beneficial for his to start the Penicillin and d/c the Cipro. Patient has an appointment on 08/16/14.

## 2014-08-16 ENCOUNTER — Ambulatory Visit (INDEPENDENT_AMBULATORY_CARE_PROVIDER_SITE_OTHER): Payer: 59 | Admitting: Infectious Disease

## 2014-08-16 ENCOUNTER — Encounter: Payer: Self-pay | Admitting: Infectious Disease

## 2014-08-16 VITALS — BP 125/72 | HR 100 | Temp 98.7°F | Wt 165.0 lb

## 2014-08-16 DIAGNOSIS — B999 Unspecified infectious disease: Secondary | ICD-10-CM

## 2014-08-16 DIAGNOSIS — L980 Pyogenic granuloma: Secondary | ICD-10-CM

## 2014-08-16 DIAGNOSIS — R599 Enlarged lymph nodes, unspecified: Secondary | ICD-10-CM

## 2014-08-16 DIAGNOSIS — T63331A Toxic effect of venom of brown recluse spider, accidental (unintentional), initial encounter: Secondary | ICD-10-CM | POA: Insufficient documentation

## 2014-08-16 DIAGNOSIS — L98491 Non-pressure chronic ulcer of skin of other sites limited to breakdown of skin: Secondary | ICD-10-CM

## 2014-08-16 DIAGNOSIS — R590 Localized enlarged lymph nodes: Secondary | ICD-10-CM

## 2014-08-16 DIAGNOSIS — A449 Bartonellosis, unspecified: Secondary | ICD-10-CM

## 2014-08-16 DIAGNOSIS — T63331D Toxic effect of venom of brown recluse spider, accidental (unintentional), subsequent encounter: Secondary | ICD-10-CM

## 2014-08-16 DIAGNOSIS — N509 Disorder of male genital organs, unspecified: Secondary | ICD-10-CM

## 2014-08-16 DIAGNOSIS — A281 Cat-scratch disease: Secondary | ICD-10-CM

## 2014-08-16 DIAGNOSIS — N50819 Testicular pain, unspecified: Secondary | ICD-10-CM

## 2014-08-16 NOTE — Progress Notes (Signed)
HPI     Subjective:    Patient ID: Cody Todd, male    DOB: Jul 03, 1996, 19 y.o.   MRN: [id number]  HPI  19 year old previously healthy with exception of chronic pityriasis lichenoides whose family adopted 2 kittens this fall. He plays with kittens frequently and is typically scratched by them on his arms and legs. In September he had developed severe bilateral testicular pain. He was seen at urgent care [facility] and given IM rocephin and Doxycycline for possible GC and chlamydia. Symptoms continued and he was started on oral bactrim and he came to ED at [facility] where testing showed:  GC, chlamydia were negative as was HIV antibody and RPR. US showed only hydroceles at that time.  CT abdomen on 04/04/14 was normal  By November he had also developed PAINFUL massive right sided inguinal lymphadenopathy.  [place] indeed showed Multiple prominent lymph nodes right groin. The largest measures 2.7 x 1.5 x 2.3 cm.  He was seen by Dr Zenaida Niece after having been seen by Urology mx times and GI for evaluation of possible hernia  He ultimately underwent excisional LN biopsy by Dr. Georgette Dover of right femoral LN.   Pathology showed NECROTIZING GRANULOMATOUS INFLAMMATION.   STains for AFB, fungi were negative. Unfortunately NO specimens were sent for AFB fungal or bacterial cultures.  We [facility] the Lymph [facility] to [facility] for TB, bacteria, fungi, Bartonella, Toxoplasma and the Bartonella PCR came back POSITIVE. I also rechecked serologies for CSD and Bartonella IgG were much higher than in November at 1:512.   We gave him a course of azithromycin and he has had in the interim nice improvement in his inguinal LA in particular at site with Dr. Georgette Dover excised the Lymph node. In the interim he developed a lesion on his distal left leg that he thought was in insect bite in which looked like raised yellowish blistering lesion. He ultimately went to ED where he was examined, blister was popped open and he  was given topical steroid that he has been using. Lesion is scabbed but not yet healed.  He continued to have excrutiating scrotal , testicular pain that has not resolved  He is without fevers, chills, malaise.  I gave him rx for oral cipro and initiallly did not seem to make much of a difference  But since then he needed dental work due to severe dental pain left upper molar and was switched to PCN and in the past 3 days he has developed excruciating RIght sided testicular pain--same side as he has painful lymphadenopathy.  07/05/2014  /    January 16 , 2016:      Left ankle lesion 07/29/14:    08/16/14:          Review of Systems  Constitutional: Negative for fever, chills, diaphoresis, activity change, appetite change, fatigue and unexpected weight change.  HENT: Negative for congestion, rhinorrhea, sinus pressure, sneezing, sore throat and trouble swallowing.   Eyes: Negative for photophobia and visual disturbance.  Respiratory: Negative for cough, chest tightness, shortness of breath, wheezing and stridor.   Cardiovascular: Negative for chest pain, palpitations and leg swelling.  Gastrointestinal: Negative for nausea, vomiting, abdominal pain, diarrhea, constipation, blood in stool, abdominal distention and anal bleeding.  Genitourinary: Positive for testicular pain. Negative for dysuria, hematuria, flank pain and difficulty urinating.  Musculoskeletal: Negative for myalgias, back pain, joint swelling, arthralgias and gait problem.  Skin: Positive for rash. Negative for color change, pallor and wound.  Neurological: Negative for dizziness, tremors, weakness and light-headedness.  Hematological: Positive for adenopathy. Does not bruise/bleed easily.  Psychiatric/Behavioral: Negative for behavioral problems, confusion, sleep disturbance, dysphoric mood, decreased concentration and agitation.       Objective:   Physical Exam  Constitutional: He is oriented to person,  place, and time. He appears well-developed and well-nourished.  HENT:  Head: Normocephalic and atraumatic.  Eyes: Conjunctivae and EOM are normal.  Neck: Normal range of motion. Neck supple.  Cardiovascular: Normal rate and regular rhythm.   Pulmonary/Chest: Effort normal. No respiratory distress. He has no wheezes.  Abdominal: Soft. He exhibits no distension.  Genitourinary: Right testis shows tenderness. Right testis shows no mass and no swelling. Left testis shows tenderness. Left testis shows no mass and no swelling. Circumcised. No phimosis.  Musculoskeletal: Normal range of motion. He exhibits no edema.  Lymphadenopathy:       Right: Inguinal adenopathy present.       Left: Inguinal adenopathy present.  Neurological: He is alert and oriented to person, place, and time.  Skin: Skin is warm and dry.  Psychiatric: He has a normal mood and affect. His behavior is normal. Judgment and thought content normal.   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 Sp doxy this fall (when being rx for presumed STDs) and now azithromycin  His LA is  At site of Dr. Vonna Kotyk excision has improved though he still has painful Left sided LA. His  scrotal pain, testicular pain persists and worse on the RIght side where his LA is more painful  I suspect that the testicular and scrotal pain which was FIRST symptom is due to lymphatic blockage and irritation that communicates into scrotum. I CANNOT find literature of case reports of epididymis with Bartonella and I suspect this pain in scrotum and testicle is due to residual inflammation that will take time to resolve.   Given his persistent suffering I am started  trial of CIPRO 500mg  BID for 2-4 weeks and even  consider adding rifampin, he then worsened off CIPRO, so will restart this and get Testicular US and he will see Urologist from [facility].  I spent greater than 25  minutes with the patient including greater than 50% of time in face to face counsel of the patient and in coordination of their care.  #2 Scrotal pain, testicular pain: see above re cipro. Will get [place] and also make sure he sees  Urologist from [facility] who practices here in [place] as well. Perhaps this is a postinfectious syndrome or just prolonged time for all of this lymphatics to calm down  #3 Dental infection: OK to continue the PCN with the cipro. He feels that the PCN is making his skin oily but I would continue it  #4 Leg wound: site where he thinks he had spider bite. Slow to resolve could have been a brown recluse. NO need for surgical intervention. Observe

## 2014-08-17 LAB — AFB CULTURE WITH SMEAR (NOT AT ARMC): Acid Fast Smear: NONE SEEN

## 2014-08-21 ENCOUNTER — Telehealth: Payer: Self-pay | Admitting: *Deleted

## 2014-08-21 NOTE — Telephone Encounter (Signed)
Thanks Tamika, I am just wanting to be consistent here. Again I dont like to see this kid or anyone suffer but this is not the reason he was sent to see [place] and his pediatrician or another PCP could rx him something if they feel comfortable and if not its not my responsibility. I will give him antibiotics to see if that will help and maybe it will. I feel bad for him

## 2014-08-21 NOTE — Telephone Encounter (Signed)
Mom said she would call the Pediatrician and see if they would prescribe something and let us know.

## 2014-08-21 NOTE — Telephone Encounter (Signed)
Has the mother spoken to the primary care physician? And what was their response?   Pain control is the responsibility of the PCP as we have discussed and as I made clear to Mr Cordial before when he asked me for pain meds. Perhaps the PCP would be willing to give ultram?

## 2014-08-21 NOTE — Telephone Encounter (Signed)
Mom concerned about the pt's scrotal pain, interupting sleep.  Wondering if there is a rx pain medication that the pt could take.  Sent Dr. Tommy Medal text page with this question.

## 2014-08-21 NOTE — Telephone Encounter (Signed)
Spoke to patient's mom and she states that his pain is very significant, and he can't relieve the pain with anything. It come and goes, but most intense at night and early morning. Mom states that it does not have to be a narcotic just something that could help relieve the pain in the meantime, that may be better than Ibuprofen. Patient is seeing the Urologist Dr. Berenice Primas on 2/18. Please advise

## 2014-08-23 ENCOUNTER — Ambulatory Visit (HOSPITAL_COMMUNITY)
Admission: RE | Admit: 2014-08-23 | Discharge: 2014-08-23 | Disposition: A | Discharge status: Home / Self Care | Payer: 59 | Source: Ambulatory Visit | Attending: Infectious Disease | Admitting: Infectious Disease

## 2014-08-23 ENCOUNTER — Other Ambulatory Visit: Payer: Self-pay | Admitting: Licensed Clinical Social Worker

## 2014-08-23 ENCOUNTER — Encounter (INDEPENDENT_AMBULATORY_CARE_PROVIDER_SITE_OTHER): Payer: Self-pay

## 2014-08-23 DIAGNOSIS — N433 Hydrocele, unspecified: Secondary | ICD-10-CM | POA: Diagnosis not present

## 2014-08-23 DIAGNOSIS — N50819 Testicular pain, unspecified: Secondary | ICD-10-CM

## 2014-08-23 DIAGNOSIS — N503 Cyst of epididymis: Secondary | ICD-10-CM | POA: Diagnosis not present

## 2014-08-23 DIAGNOSIS — N509 Disorder of male genital organs, unspecified: Secondary | ICD-10-CM

## 2014-08-23 DIAGNOSIS — N508 Other specified disorders of male genital organs: Secondary | ICD-10-CM | POA: Diagnosis present

## 2014-08-23 MED ORDER — TRAMADOL HCL 50 MG PO TABS: 50.0000 mg | ORAL_TABLET | Freq: Four times a day (QID) | ORAL | Status: DC | PRN

## 2014-08-31 ENCOUNTER — Other Ambulatory Visit (HOSPITAL_COMMUNITY): Payer: Self-pay | Admitting: Urology

## 2014-08-31 DIAGNOSIS — N509 Disorder of male genital organs, unspecified: Secondary | ICD-10-CM

## 2014-08-31 DIAGNOSIS — N50819 Testicular pain, unspecified: Secondary | ICD-10-CM

## 2014-09-05 ENCOUNTER — Ambulatory Visit (HOSPITAL_COMMUNITY)
Admission: RE | Admit: 2014-09-05 | Discharge: 2014-09-05 | Disposition: A | Discharge status: Home / Self Care | Payer: 59 | Source: Ambulatory Visit | Attending: Urology | Admitting: Urology

## 2014-09-05 DIAGNOSIS — N50819 Testicular pain, unspecified: Secondary | ICD-10-CM

## 2014-09-05 DIAGNOSIS — N509 Disorder of male genital organs, unspecified: Secondary | ICD-10-CM | POA: Insufficient documentation

## 2014-09-05 MED ORDER — IOHEXOL 300 MG/ML  SOLN
80.0000 mL | Freq: Once | INTRAMUSCULAR | Status: AC | PRN
  Administered 2014-09-05: 80 mL via INTRAVENOUS

## 2014-09-08 ENCOUNTER — Encounter: Payer: Self-pay | Admitting: Infectious Disease

## 2014-09-13 ENCOUNTER — Ambulatory Visit: Payer: 59 | Admitting: Infectious Disease

## 2014-10-11 ENCOUNTER — Ambulatory Visit (INDEPENDENT_AMBULATORY_CARE_PROVIDER_SITE_OTHER): Payer: 59 | Admitting: Infectious Disease

## 2014-10-11 ENCOUNTER — Encounter: Payer: Self-pay | Admitting: Infectious Disease

## 2014-10-11 VITALS — BP 114/69 | HR 97 | Temp 98.5°F | Ht 71.0 in | Wt 167.0 lb

## 2014-10-11 DIAGNOSIS — A449 Bartonellosis, unspecified: Secondary | ICD-10-CM

## 2014-10-11 DIAGNOSIS — A281 Cat-scratch disease: Secondary | ICD-10-CM | POA: Diagnosis not present

## 2014-10-11 DIAGNOSIS — R1314 Dysphagia, pharyngoesophageal phase: Secondary | ICD-10-CM | POA: Diagnosis not present

## 2014-10-11 DIAGNOSIS — B999 Unspecified infectious disease: Secondary | ICD-10-CM

## 2014-10-11 DIAGNOSIS — L98491 Non-pressure chronic ulcer of skin of other sites limited to breakdown of skin: Secondary | ICD-10-CM

## 2014-10-11 DIAGNOSIS — L980 Pyogenic granuloma: Secondary | ICD-10-CM

## 2014-10-11 DIAGNOSIS — N50819 Testicular pain, unspecified: Secondary | ICD-10-CM

## 2014-10-11 DIAGNOSIS — N509 Disorder of male genital organs, unspecified: Secondary | ICD-10-CM

## 2014-10-11 NOTE — Progress Notes (Signed)
   Subjective:    Patient ID: Cody Todd, male    DOB: February 24, 1996, 19 y.o.   MRN: [id number]  HPI Cody Todd is a 19 y/o male who presents to the clinic for f/u of cat scratch fever and scrotal pain. He recently went to see a urologist regarding the scrotal pain. A CT of abdomen and pelvis was ordered and did not have any significant findings. He was given oxycodone and neurontin by urology. [name] states that his pain improved for the oxy, but this was not prescribed again for him. Neurontin has not worked well in pain Optician, dispensing. He notes that the pain is worse when moving and better when laying down. His pain is constant and sharp, stating it "brings him to his knees". He has finished his course of Cipro, stating it has made no difference in his pain.    Review of Systems Constitutional: Negative for fever, chills, diaphoresis, activity change, appetite change, fatigue and unexpected weight change.  HENT: Negative for congestion, rhinorrhea, sinus pressure, sneezing, sore throat and trouble swallowing.  Eyes: Negative for photophobia and visual disturbance.  Respiratory: Negative for cough, chest tightness, shortness of breath, wheezing and stridor.  Cardiovascular: Negative for chest pain, palpitations and leg swelling.  Gastrointestinal: Negative for nausea, vomiting, abdominal pain, diarrhea, constipation, blood in stool, abdominal distention and anal bleeding.  Genitourinary: Positive for testicular pain. Negative for dysuria, hematuria, flank pain and difficulty urinating.  Musculoskeletal: Negative for myalgias, back pain, joint swelling, arthralgias and gait problem.  Skin: Positive for rash. Negative for color change, pallor and wound.  Neurological: Negative for dizziness, tremors, weakness and light-headedness.  Hematological: Positive for adenopathy. Does not bruise/bleed easily.  Psychiatric/Behavioral: Negative for behavioral problems, confusion, sleep disturbance,  dysphoric mood, decreased concentration and agitation.     Objective:   Physical Exam Constitutional: He is oriented to person, place, and time. He appears well-developed and well-nourished.  HENT:  Head: Normocephalic and atraumatic.  Eyes: Conjunctivae and EOM are normal.  Neck: Normal range of motion. Neck supple.  Cardiovascular: Normal rate and regular rhythm.  Pulmonary/Chest: Effort normal. No respiratory distress. He has no wheezes.  Abdominal: Soft. He exhibits no distension.  Genitourinary: Right testis shows tenderness. Right testis shows no mass and no swelling. Left testis shows tenderness. Left testis shows no mass and no swelling. Circumcised. No phimosis.  Musculoskeletal: Normal range of motion. He exhibits no edema.  Lymphadenopathy:   Right: Inguinal adenopathy present.   Left: Inguinal adenopathy present.  Neurological: He is alert and oriented to person, place, and time.  Skin: Skin is warm and dry.  Psychiatric: He has a normal mood and affect. His behavior is normal. Judgment and thought content normal.   His lymph node where excision was performed is dramatically better       Assessment & Plan:

## 2014-10-11 NOTE — Progress Notes (Signed)
Subjective:    Patient ID: Cody Todd, male    DOB: [birth year], 19 y.o.   MRN: [id number]  HPI  For details please see Student NP Lauren O'Neil's note above.  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 I believe his scrotal pain has been due to his lymphadenopathy and lymphatic blockage and that it simply preceded the overt declaration of his CSD with massive regional LA.  I suspect his persistent pain is a post infectious syndrome + potential still residual lymphatic problems +/- problems with opioid withdrawal.  He has new symptoms of pain in right mouth with swallowing and feels LN in neck have swelled up.  His leg lesion has improved.   Review of Systems  Constitutional: Negative for fever, chills, diaphoresis, activity change, appetite change, fatigue and unexpected weight change.  HENT: Positive for mouth sores, sore throat and trouble swallowing. Negative for congestion, rhinorrhea, sinus pressure and sneezing.   Eyes: Negative for photophobia and visual disturbance.  Respiratory: Negative for cough, chest tightness, shortness of breath, wheezing and stridor.   Cardiovascular: Negative for chest pain, palpitations and leg swelling.  Gastrointestinal: Negative for nausea, vomiting, abdominal pain, diarrhea, constipation, blood in stool, abdominal distention and anal bleeding.  Genitourinary: Positive for scrotal swelling and testicular pain. Negative for dysuria, hematuria, flank pain and difficulty urinating.  Musculoskeletal: Negative for myalgias, back pain, joint swelling, arthralgias and gait problem.  Skin: Negative for color change, pallor, rash and wound.  Neurological: Negative for dizziness, tremors, weakness and light-headedness.  Hematological: Positive for adenopathy. Does not bruise/bleed easily.    Psychiatric/Behavioral: Negative for behavioral problems, confusion, sleep disturbance, dysphoric mood, decreased concentration and agitation.       Objective:   Physical Exam  Constitutional: He is oriented to person, place, and time. He appears well-developed and well-nourished.  HENT:  Head: Normocephalic and atraumatic.  Mouth/Throat: Mucous membranes are normal. He does not have dentures. No oral lesions. No trismus in the jaw. Normal dentition. No dental abscesses, uvula swelling, lacerations or dental caries. No posterior oropharyngeal edema, posterior oropharyngeal erythema or tonsillar abscesses.  Eyes: Conjunctivae and EOM are normal.  Neck: Normal range of motion. Neck supple.  Cardiovascular: Normal rate and regular rhythm.   Pulmonary/Chest: Effort normal. No respiratory distress. He has no wheezes.  Abdominal: Soft. He exhibits no distension.  Genitourinary: Penis normal. Right testis shows tenderness. Right testis shows no mass. Left testis shows no mass and no tenderness. Circumcised.  Musculoskeletal: Normal range of motion. He exhibits no edema or tenderness.  Lymphadenopathy:    He has cervical adenopathy.       Right: Inguinal adenopathy present.       Left: Inguinal adenopathy present.  Neurological: He is alert and oriented to person, place, and time.  Skin: Skin is warm and dry. No rash noted. No erythema. No pallor.    Inguinal lymph node biopsy site:  .  07/05/2014 /    January 16 , 2016:     October 11 2014:      Left ankle lesion 07/29/14:    08/16/14:     October 11, 2014:         Review of Systems  Constitutional: Negative for fever, chills, diaphoresis, activity change, appetite change, fatigue and unexpected weight change.  HENT: Negative for congestion, rhinorrhea, sinus pressure, sneezing, sore throat and trouble swallowing.  Eyes: Negative for photophobia and visual disturbance.  Respiratory: Negative for cough, chest  tightness, shortness of breath, wheezing and stridor.   Cardiovascular: Negative for chest pain, palpitations and leg swelling.  Gastrointestinal: Negative for nausea, vomiting, abdominal pain, diarrhea, constipation, blood in stool, abdominal distention and anal bleeding.  Genitourinary: Positive for testicular pain. Negative for dysuria, hematuria, flank pain and difficulty urinating.  Musculoskeletal: Negative for myalgias, back pain, joint swelling, arthralgias and gait problem.  Skin: Positive for rash. Negative for color change, pallor and wound.  Neurological: Negative for dizziness, tremors, weakness and light-headedness.  Hematological: Positive for adenopathy. Does not bruise/bleed easily.  Psychiatric/Behavioral: Negative for behavioral problems, confusion, sleep disturbance, dysphoric mood, decreased concentration and agitation.       Objective:   Physical Exam  Constitutional: He is oriented to person, place, and time. He appears well-developed and well-nourished.  HENT:  Head: Normocephalic and atraumatic.  Eyes: Conjunctivae and EOM are normal.  Neck: Normal range of motion. Neck supple.  Cardiovascular: Normal rate and regular rhythm.   Pulmonary/Chest: Effort normal. No respiratory distress. He has no wheezes.  Abdominal: Soft. He exhibits no distension.  Genitourinary: Right testis shows tenderness. Right testis shows no mass and no swelling. Left testis shows tenderness. Left testis shows no mass and no swelling. Circumcised. No phimosis.  Musculoskeletal: Normal range of motion. He exhibits no edema.  Lymphadenopathy:       Right: Inguinal adenopathy present.       Left: Inguinal adenopathy present.  Neurological: He is alert and oriented to person, place, and time.  Skin: Skin is warm and dry.  Psychiatric: He has a normal mood and affect. His behavior is normal. Judgment and thought content normal.   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 Sp doxy this fall (when being rx for presumed STDs) ,  Azithromycin and now protracted cipro and rifampin  . I spent greater than 25  minutes with the patient including greater than 50% of time in face to face counsel of the patient and in coordination of their care.  #2 Scrotal pain, testicular pain: see above discussion  #3 Dysphagia and Cervical LA: will check EBV, CMV and HIV  #4 Leg wound: stable

## 2014-10-12 LAB — EBV AB TO VIRAL CAPSID AG PNL, IGG+IGM: EBV VCA IgG: 96.8 U/mL — ABNORMAL HIGH (ref <18.0)

## 2014-10-12 LAB — EPSTEIN-BARR VIRUS EARLY D ANTIGEN ANTIBODY, IGG

## 2014-10-12 LAB — HIV ANTIBODY (ROUTINE TESTING W REFLEX): HIV 1&2 Ab, 4th Generation: NONREACTIVE

## 2014-10-12 LAB — EPSTEIN-BARR VIRUS NUCLEAR ANTIGEN ANTIBODY, IGG: EBV NA IgG: <3 U/mL (ref <18.0)

## 2014-10-13 LAB — CMV ABS, IGG+IGM (CYTOMEGALOVIRUS)
CMV IGM: 12.6 [AU]/ml (ref <30.00)
Cytomegalovirus Ab-IgG: 4.2 U/mL — ABNORMAL HIGH (ref <0.60)

## 2014-10-15 LAB — RFLX B. HENSELAE IGG TITER: B. Henselae IgG Titer: 1:128 {titer} — ABNORMAL HIGH

## 2014-10-15 LAB — BARTONELLA ANTIBODY PANEL

## 2014-10-15 LAB — BARTONELLA ANITBODY PANEL
B HENSELAE IGG: POSITIVE — AB
B HENSELAE IGM: NEGATIVE

## 2014-11-11 ENCOUNTER — Emergency Department (HOSPITAL_COMMUNITY)
Admission: EM | Admit: 2014-11-11 | Discharge: 2014-11-11 | Disposition: A | Discharge status: Home / Self Care | Payer: 59 | Source: Home / Self Care | Attending: Family Medicine | Admitting: Family Medicine

## 2014-11-11 ENCOUNTER — Encounter (HOSPITAL_COMMUNITY): Payer: Self-pay | Admitting: Emergency Medicine

## 2014-11-11 ENCOUNTER — Emergency Department (INDEPENDENT_AMBULATORY_CARE_PROVIDER_SITE_OTHER): Payer: 59

## 2014-11-11 DIAGNOSIS — S20212A Contusion of left front wall of thorax, initial encounter: Secondary | ICD-10-CM | POA: Diagnosis not present

## 2014-11-11 MED ORDER — TRAMADOL HCL 50 MG PO TABS: 50.0000 mg | ORAL_TABLET | Freq: Four times a day (QID) | ORAL | Status: DC | PRN

## 2014-11-11 MED ORDER — IBUPROFEN 800 MG PO TABS: 800.0000 mg | ORAL_TABLET | Freq: Three times a day (TID) | ORAL | Status: DC | PRN

## 2014-11-11 MED ORDER — IBUPROFEN 800 MG PO TABS
ORAL_TABLET | ORAL | Status: AC
  Filled 2014-11-11: qty 1

## 2014-11-11 MED ORDER — IBUPROFEN 800 MG PO TABS
800.0000 mg | ORAL_TABLET | Freq: Once | ORAL | Status: AC
  Administered 2014-11-11: 800 mg via ORAL

## 2014-11-11 NOTE — ED Provider Notes (Signed)
CSN: [id number]     Arrival date & time 11/11/14  1255 History   None    Chief Complaint  Patient presents with  . Chest Pain   (Consider location/radiation/quality/duration/timing/severity/associated sxs/prior Treatment) HPI      19 year old male presents for evaluation of left rib cage pain. Yesterday he fell onto some steps, striking his left anterior lateral rib cage. Since then he has constant pain that is worse with any movements, worse with any deep breathing or coughing. He has no shortness of breath at rest. He had no pain preceding this. No history of any rib injuries.  Past Medical History  Diagnosis Date  . Irritable bowel syndrome with diarrhea     no current med.  . Pityriasis lichenoides     arms and legs  . Anxiety   . Lymphadenopathy, inguinal 06/2014    right  . Hematuria, microscopic 05/2014  . Joint pain 06/13/2014    knees and legs  . Cat-scratch disease   . High serum Bartonella henselae antibody titer   . Cat-scratch disease   . Persistent testicular pain   . Skin ulcer    Past Surgical History  Procedure Laterality Date  . Ankle arthroscopy with drilling/microfracture Right 12/15/2013    Procedure: RIGHT ANKLE ARTHROSCOPY WITH MICRO FRACTURE ;  Surgeon: Wylene Simmer, MD;  Location: [place];  Service: Orthopedics;  Laterality: Right;  . Lymph node biopsy Right 06/15/2014    Procedure: RIGHT FEMORAL LYMPH NODE BIOPSY;  Surgeon: Donnie Mesa, MD;  Location: [place];  Service: General;  Laterality: Right;   Family History  Problem Relation Age of Onset  . Obesity Father   . ADD / ADHD Brother   . Depression Brother   . Mitral valve prolapse Maternal Grandmother   . Cancer Maternal Grandmother     uterine  . Dementia Maternal Grandfather   . Thyroid disease Maternal Grandfather   . Cancer Paternal Grandfather     renal  . Hypertension Mother    History  Substance Use Topics  . Smoking status: Former Research scientist (life sciences)  .  Smokeless tobacco: Never Used     Comment: mother thinks he has not smoked in a few months  . Alcohol Use: 0.0 oz/week    0 Standard drinks or equivalent per week     Comment: occasional    Review of Systems  Cardiovascular: Positive for chest pain (left rib cage).  All other systems reviewed and are negative.   Allergies  Lactose intolerance (gi)  Home Medications   Prior to Admission medications   Medication Sig Start Date End Date Taking? Authorizing Provider  ibuprofen (ADVIL,MOTRIN) 800 MG tablet Take 1 tablet (800 mg total) by mouth every 8 (eight) hours as needed. 11/11/14   Liam Graham, PA-C  traMADol (ULTRAM) 50 MG tablet Take 1 tablet (50 mg total) by mouth every 6 (six) hours as needed. 11/11/14   Freeman Caldron Daishia Fetterly, PA-C   BP 128/76 mmHg  Pulse 79  Temp(Src) 97.1 F (36.2 C) (Oral)  Resp 16  SpO2 97% Physical Exam  Constitutional: He is oriented to person, place, and time. He appears well-developed and well-nourished. No distress.  HENT:  Head: Normocephalic.  Cardiovascular: Normal rate, regular rhythm and normal heart sounds.   Pulmonary/Chest: Effort normal and breath sounds normal. No respiratory distress. He exhibits tenderness and swelling.  Tenderness and swelling of the left lateral lower rib cage around the anterior axillary line and medially to this with  some swelling  Neurological: He is alert and oriented to person, place, and time. Coordination normal.  Skin: Skin is warm and dry. No rash noted. He is not diaphoretic.  Psychiatric: He has a normal mood and affect. Judgment normal.  Nursing note and vitals reviewed.   ED Course  Procedures (including critical care time) Labs Review Labs Reviewed - No data to display  Imaging Review Dg Ribs Unilateral W/chest Left  11/11/2014   CLINICAL DATA:  Fall, left rib pain  EXAM: LEFT RIBS AND CHEST - 3+ VIEW  COMPARISON:  None.  FINDINGS: Lungs are clear.  No pleural effusion or pneumothorax.  The heart is  normal in size.  No displaced left rib fracture is seen.  IMPRESSION: No evidence of acute cardiopulmonary disease.  No displaced left rib fracture is seen.   Electronically Signed   By: Julian Hy M.D.   On: 11/11/2014 14:41     MDM   1. Contusion of rib, left, initial encounter    X-rays negative for any fracture. Treat symptomatically. Return precautions were discussed   Meds ordered this encounter  Medications  . ibuprofen (ADVIL,MOTRIN) tablet 800 mg    Sig:   . ibuprofen (ADVIL,MOTRIN) 800 MG tablet    Sig: Take 1 tablet (800 mg total) by mouth every 8 (eight) hours as needed.    Dispense:  30 tablet    Refill:  0    Order Specific Question:  Supervising Provider    Answer:  Billy Fischer [phone]  . traMADol (ULTRAM) 50 MG tablet    Sig: Take 1 tablet (50 mg total) by mouth every 6 (six) hours as needed.    Dispense:  10 tablet    Refill:  0    Order Specific Question:  Supervising Provider    Answer:  Ihor Gully D [[id number]]       Liam Graham, PA-C 11/11/14 [phone]

## 2014-11-11 NOTE — Discharge Instructions (Signed)
Chest Contusion °A chest contusion is a deep bruise on your chest area. Contusions are the result of an injury that caused bleeding under the skin. A chest contusion may involve bruising of the skin, muscles, or ribs. The contusion may turn blue, purple, or yellow. Minor injuries will give you a painless contusion, but more severe contusions may stay painful and swollen for a few weeks. °CAUSES  °A contusion is usually caused by a blow, trauma, or direct force to an area of the body. °SYMPTOMS  °· Swelling and redness of the injured area. °· Discoloration of the injured area. °· Tenderness and soreness of the injured area. °· Pain. °DIAGNOSIS  °The diagnosis can be made by taking a history and performing a physical exam. An X-ray, CT scan, or MRI may be needed to determine if there were any associated injuries, such as broken bones (fractures) or internal injuries. °TREATMENT  °Often, the best treatment for a chest contusion is resting, icing, and applying cold compresses to the injured area. Deep breathing exercises may be recommended to reduce the risk of pneumonia. Over-the-counter medicines may also be recommended for pain control. °HOME CARE INSTRUCTIONS  °· Put ice on the injured area. °· Put ice in a plastic bag. °· Place a towel between your skin and the bag. °· Leave the ice on for 15-20 minutes, 03-04 times a day. °· Only take over-the-counter or prescription medicines as directed by your caregiver. Your caregiver may recommend avoiding anti-inflammatory medicines (aspirin, ibuprofen, and naproxen) for 48 hours because these medicines may increase bruising. °· Rest the injured area. °· Perform deep-breathing exercises as directed by your caregiver. °· Stop smoking if you smoke. °· Do not lift objects over 5 pounds (2.3 kg) for 3 days or longer if recommended by your caregiver. °SEEK IMMEDIATE MEDICAL CARE IF:  °· You have increased bruising or swelling. °· You have pain that is getting worse. °· You have  difficulty breathing. °· You have dizziness, weakness, or fainting. °· You have blood in your urine or stool. °· You cough up or vomit blood. °· Your swelling or pain is not relieved with medicines. °MAKE SURE YOU:  °· Understand these instructions. °· Will watch your condition. °· Will get help right away if you are not doing well or get worse. °Document Released: 03/25/2001 Document Revised: 03/24/2012 Document Reviewed: 12/22/2011 °ExitCare® Patient Information ©2015 [employer]. This information is not intended to replace advice given to you by your health care provider. Make sure you discuss any questions you have with your health care provider. ° °Rib Contusion °A rib contusion (bruise) can occur by a blow to the chest or by a fall against a hard object. Usually these will be much better in a couple weeks. If X-rays were taken today and there are no broken bones (fractures), the diagnosis of bruising is made. However, broken ribs may not show up for several days, or may be discovered later on a routine X-ray when signs of healing show up. If this happens to you, it does not mean that something was missed on the X-ray, but simply that it did not show up on the first X-rays. Earlier diagnosis will not usually change the treatment. °HOME CARE INSTRUCTIONS  °· Avoid strenuous activity. Be careful during activities and avoid bumping the injured ribs. Activities that pull on the injured ribs and cause pain should be avoided, if possible. °· For the first day or two, an ice pack used every 20 minutes while   awake may be helpful. Put ice in a plastic bag and put a towel between the bag and the skin. °· Eat a normal, well-balanced diet. Drink plenty of fluids to avoid constipation. °· Take deep breaths several times a day to keep lungs free of infection. Try to cough several times a day. Splint the injured area with a pillow while coughing to ease pain. Coughing can help prevent pneumonia. °· Wear a rib belt or binder  only if told to do so by your caregiver. If you are wearing a rib belt or binder, you must do the breathing exercises as directed by your caregiver. If not used properly, rib belts or binders restrict breathing which can lead to pneumonia. °· Only take over-the-counter or prescription medicines for pain, discomfort, or fever as directed by your caregiver. °SEEK MEDICAL CARE IF:  °· You or your child has an oral temperature above 102° F (38.9° C). °· Your baby is older than 3 months with a rectal temperature of 100.5° F (38.1° C) or higher for more than 1 day. °· You develop a cough, with thick or bloody sputum. °SEEK IMMEDIATE MEDICAL CARE IF:  °· You have difficulty breathing. °· You feel sick to your stomach (nausea), have vomiting or belly (abdominal) pain. °· You have worsening pain, not controlled with medications, or there is a change in the location of the pain. °· You develop sweating or radiation of the pain into the arms, jaw or shoulders, or become light headed or faint. °· You or your child has an oral temperature above 102° F (38.9° C), not controlled by medicine. °· Your or your baby is older than 3 months with a rectal temperature of 102° F (38.9° C) or higher. °· Your baby is 3 months old or younger with a rectal temperature of 100.4° F (38° C) or higher. °MAKE SURE YOU:  °· Understand these instructions. °· Will watch your condition. °· Will get help right away if you are not doing well or get worse. °Document Released: 03/25/2001 Document Revised: 10/25/2012 Document Reviewed: 02/16/2008 °ExitCare® Patient Information ©2015 [employer]. This information is not intended to replace advice given to you by your health care provider. Make sure you discuss any questions you have with your health care provider. ° °

## 2014-11-11 NOTE — ED Notes (Signed)
Left rib cage pain after falling on steps yesterday.  struck left rib cage.  Patient has swelling to left lower ribcage.

## 2014-12-12 ENCOUNTER — Ambulatory Visit: Payer: 59 | Admitting: Infectious Disease

## 2014-12-23 ENCOUNTER — Emergency Department (HOSPITAL_COMMUNITY)
Admission: EM | Admit: 2014-12-23 | Discharge: 2014-12-24 | Discharge status: Against Medical Advice | Payer: 59 | Attending: Emergency Medicine | Admitting: Emergency Medicine

## 2014-12-23 ENCOUNTER — Encounter (HOSPITAL_COMMUNITY): Payer: Self-pay | Admitting: Emergency Medicine

## 2014-12-23 DIAGNOSIS — X781XXA Intentional self-harm by knife, initial encounter: Secondary | ICD-10-CM | POA: Diagnosis not present

## 2014-12-23 DIAGNOSIS — S61512A Laceration without foreign body of left wrist, initial encounter: Secondary | ICD-10-CM | POA: Insufficient documentation

## 2014-12-23 DIAGNOSIS — Y9389 Activity, other specified: Secondary | ICD-10-CM | POA: Insufficient documentation

## 2014-12-23 DIAGNOSIS — IMO0002 Reserved for concepts with insufficient information to code with codable children: Secondary | ICD-10-CM

## 2014-12-23 DIAGNOSIS — Y9289 Other specified places as the place of occurrence of the external cause: Secondary | ICD-10-CM | POA: Insufficient documentation

## 2014-12-23 DIAGNOSIS — Y998 Other external cause status: Secondary | ICD-10-CM | POA: Diagnosis not present

## 2014-12-23 DIAGNOSIS — Z7289 Other problems related to lifestyle: Secondary | ICD-10-CM
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
## 2014-12-23 NOTE — ED Provider Notes (Signed)
2335 - Patient seen walking out of ED followed by family and presumed girlfriend, prior to evaluation by this provider. Patient was in the ED voluntarily. He denied SI/HI in triage. Disposition set to LWBS.    Antonietta Breach, PA-C 12/23/14 2336  Julianne Rice, MD 12/24/14 [phone]

## 2015-01-17 ENCOUNTER — Ambulatory Visit (HOSPITAL_BASED_OUTPATIENT_CLINIC_OR_DEPARTMENT_OTHER)
Admission: RE | Admit: 2015-01-17 | Discharge: 2015-01-17 | Disposition: A | Discharge status: Home / Self Care | Payer: 59 | Source: Ambulatory Visit | Attending: Family Medicine | Admitting: Family Medicine

## 2015-01-17 ENCOUNTER — Encounter: Payer: Self-pay | Admitting: Family Medicine

## 2015-01-17 ENCOUNTER — Ambulatory Visit (INDEPENDENT_AMBULATORY_CARE_PROVIDER_SITE_OTHER): Payer: 59 | Admitting: Family Medicine

## 2015-01-17 VITALS — BP 140/82 | HR 124 | Ht 72.0 in | Wt 165.0 lb

## 2015-01-17 DIAGNOSIS — M25562 Pain in left knee: Secondary | ICD-10-CM | POA: Diagnosis not present

## 2015-01-17 NOTE — Patient Instructions (Signed)
Your x-rays are reassuring. You have a severe knee contusion. Start physical therapy and do home exercises on days you don't go to therapy. Icing 15 minutes at a time if needed. Aleve 2 tabs twice a day with food if needed for pain and inflammation. Elevate if needed for swelling. Follow up with me in 5-6 weeks.

## 2015-01-18 DIAGNOSIS — M25562 Pain in left knee: Secondary | ICD-10-CM | POA: Insufficient documentation

## 2015-01-18 NOTE — Progress Notes (Signed)
PCP: No primary care provider on file.  Subjective:   HPI: Patient is a 19 y.o. male here for left knee pain.  Patient reports he tripped and fell directly onto left knee about 1 month ago. Some swelling then. Did not seek care for this. Pain level still at a 5/10, up to 9/10 at times. Difficulty with bending leg. No catching, locking, giving out.  Past Medical History  Diagnosis Date  . Irritable bowel syndrome with diarrhea     no current med.  . Pityriasis lichenoides     arms and legs  . Anxiety   . Lymphadenopathy, inguinal 06/2014    right  . Hematuria, microscopic 05/2014  . Joint pain 06/13/2014    knees and legs  . Cat-scratch disease   . High serum Bartonella henselae antibody titer   . Cat-scratch disease   . Persistent testicular pain   . Skin ulcer     Current Outpatient Prescriptions on File Prior to Visit  Medication Sig Dispense Refill  . ALPRAZolam (XANAX XR) 1 MG 24 hr tablet Take 1 mg by mouth every morning.  0  . Escitalopram Oxalate (LEXAPRO PO) Take 1 tablet by mouth at bedtime.    Marland Kitchen ibuprofen (ADVIL,MOTRIN) 800 MG tablet Take 1 tablet (800 mg total) by mouth every 8 (eight) hours as needed. 30 tablet 0  . traMADol (ULTRAM) 50 MG tablet Take 1 tablet (50 mg total) by mouth every 6 (six) hours as needed. (Patient not taking: Reported on 12/23/2014) 10 tablet 0  . TRAZODONE HCL PO Take 1 tablet by mouth at bedtime as needed (sleep).     No current facility-administered medications on file prior to visit.    Past Surgical History  Procedure Laterality Date  . Ankle arthroscopy with drilling/microfracture Right 12/15/2013    Procedure: RIGHT ANKLE ARTHROSCOPY WITH MICRO FRACTURE ;  Surgeon: Wylene Simmer, MD;  Location: [place];  Service: Orthopedics;  Laterality: Right;  . Lymph node biopsy Right 06/15/2014    Procedure: RIGHT FEMORAL LYMPH NODE BIOPSY;  Surgeon: Donnie Mesa, MD;  Location: [place];  Service: General;   Laterality: Right;    Allergies  Allergen Reactions  . Lactose Intolerance (Gi) Diarrhea    History   Social History  . Marital Status: Single    Spouse Name: N/A  . Number of Children: N/A  . Years of Education: N/A   Occupational History  . Not on file.   Social History Main Topics  . Smoking status: Former Research scientist (life sciences)  . Smokeless tobacco: Never Used     Comment: mother thinks he has not smoked in a few months  . Alcohol Use: 0.0 oz/week    0 Standard drinks or equivalent per week     Comment: occasional  . Drug Use: 1.00 per week    Special: Marijuana  . Sexual Activity:    Partners: Female    Patent examiner Protection: Condom   Other Topics Concern  . Not on file   Social History Narrative   Lives with mom and dad and 3 brothers.   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           Family History  Problem Relation Age of Onset  . Obesity Father   . ADD / ADHD Brother   . Depression Brother   . Mitral valve prolapse Maternal Grandmother   . Cancer Maternal Grandmother     uterine  . Dementia Maternal Grandfather   . Thyroid disease  Maternal Grandfather   . Cancer Paternal Grandfather     renal  . Hypertension Mother     BP 140/82 mmHg  Pulse 124  Ht 6' (1.829 m)  Wt 165 lb (74.844 kg)  BMI 22.37 kg/m2  Review of Systems: See HPI above.    Objective:  Physical Exam:  Gen: NAD  Left knee: No gross deformity, ecchymoses, effusion. Mild TTP anteromedial joint line.  No other tenderness. FROM. Negative ant/post drawers. Negative valgus/varus testing. Negative lachmanns. Negative mcmurrays, apleys, patellar apprehension. NV intact distally.    Assessment & Plan:  1. Left knee injury - mechanism and exam suggest contusion.  Radiographs negative, reassuring.  Start physical therapy and home exercises.  Icing, nsaids as needed.  F/u in 5-6 weeks.

## 2015-01-18 NOTE — Assessment & Plan Note (Signed)
mechanism and exam suggest contusion.  Radiographs negative, reassuring.  Start physical therapy and home exercises.  Icing, nsaids as needed.  F/u in 5-6 weeks.

## 2015-01-26 ENCOUNTER — Encounter (HOSPITAL_COMMUNITY): Payer: Self-pay | Admitting: Emergency Medicine

## 2015-01-26 ENCOUNTER — Emergency Department (INDEPENDENT_AMBULATORY_CARE_PROVIDER_SITE_OTHER): Payer: 59

## 2015-01-26 ENCOUNTER — Emergency Department (HOSPITAL_COMMUNITY)
Admission: EM | Admit: 2015-01-26 | Discharge: 2015-01-26 | Disposition: A | Discharge status: Home / Self Care | Payer: 59 | Source: Home / Self Care | Attending: Family Medicine | Admitting: Family Medicine

## 2015-01-26 DIAGNOSIS — IMO0002 Reserved for concepts with insufficient information to code with codable children: Secondary | ICD-10-CM

## 2015-01-26 DIAGNOSIS — T148 Other injury of unspecified body region: Secondary | ICD-10-CM

## 2015-01-26 DIAGNOSIS — Z23 Encounter for immunization: Secondary | ICD-10-CM | POA: Diagnosis not present

## 2015-01-26 MED ORDER — TETANUS-DIPHTH-ACELL PERTUSSIS 5-2.5-18.5 LF-MCG/0.5 IM SUSP
0.5000 mL | Freq: Once | INTRAMUSCULAR | Status: AC
  Administered 2015-01-26: 0.5 mL via INTRAMUSCULAR

## 2015-01-26 MED ORDER — IBUPROFEN 800 MG PO TABS
800.0000 mg | ORAL_TABLET | Freq: Once | ORAL | Status: AC
  Administered 2015-01-26: 800 mg via ORAL

## 2015-01-26 MED ORDER — IBUPROFEN 800 MG PO TABS
ORAL_TABLET | ORAL | Status: AC
  Filled 2015-01-26: qty 1

## 2015-01-26 NOTE — ED Provider Notes (Signed)
CSN: [id number]     Arrival date & time 01/26/15  1455 History   First MD Initiated Contact with Patient 01/26/15 1532     Chief Complaint  Patient presents with  . Laceration   (Consider location/radiation/quality/duration/timing/severity/associated sxs/prior Treatment) HPI  Partially one hour prior to presentation patient states that he went to punch some glass out of a window pain that was artery broken in his Route. States that he cut his forearm on glass at the time. Medially started to bleed. Apply pressure with resolution of the bleeding. Successful function and sensation in his wrist and hands. Last tetanus greater than 10 years ago. Is constant and unchanged since onset. Right forearm   Past Medical History  Diagnosis Date  . Irritable bowel syndrome with diarrhea     no current med.  . Pityriasis lichenoides     arms and legs  . Anxiety   . Lymphadenopathy, inguinal 06/2014    right  . Hematuria, microscopic 05/2014  . Joint pain 06/13/2014    knees and legs  . Cat-scratch disease   . High serum Bartonella henselae antibody titer   . Cat-scratch disease   . Persistent testicular pain   . Skin ulcer    Past Surgical History  Procedure Laterality Date  . Ankle arthroscopy with drilling/microfracture Right 12/15/2013    Procedure: RIGHT ANKLE ARTHROSCOPY WITH MICRO FRACTURE ;  Surgeon: Wylene Simmer, MD;  Location: [place];  Service: Orthopedics;  Laterality: Right;  . Lymph node biopsy Right 06/15/2014    Procedure: RIGHT FEMORAL LYMPH NODE BIOPSY;  Surgeon: Donnie Mesa, MD;  Location: [place];  Service: General;  Laterality: Right;   Family History  Problem Relation Age of Onset  . Obesity Father   . ADD / ADHD Brother   . Depression Brother   . Mitral valve prolapse Maternal Grandmother   . Cancer Maternal Grandmother     uterine  . Dementia Maternal Grandfather   . Thyroid disease Maternal Grandfather   . Cancer Paternal  Grandfather     renal  . Hypertension Mother    History  Substance Use Topics  . Smoking status: Former Research scientist (life sciences)  . Smokeless tobacco: Never Used     Comment: mother thinks he has not smoked in a few months  . Alcohol Use: 0.0 oz/week    0 Standard drinks or equivalent per week     Comment: occasional    Review of Systems Per HPI with all other pertinent systems negative.   Allergies  Lactose intolerance (gi)  Home Medications   Prior to Admission medications   Medication Sig Start Date End Date Taking? Authorizing Provider  ALPRAZolam (XANAX XR) 1 MG 24 hr tablet Take 1 mg by mouth every morning. 11/22/14   Historical Provider, MD  Escitalopram Oxalate (LEXAPRO PO) Take 1 tablet by mouth at bedtime.    Historical Provider, MD  ibuprofen (ADVIL,MOTRIN) 800 MG tablet Take 1 tablet (800 mg total) by mouth every 8 (eight) hours as needed. 11/11/14   Liam Graham, PA-C  traMADol (ULTRAM) 50 MG tablet Take 1 tablet (50 mg total) by mouth every 6 (six) hours as needed. Patient not taking: Reported on 12/23/2014 11/11/14   Liam Graham, PA-C  TRAZODONE HCL PO Take 1 tablet by mouth at bedtime as needed (sleep).    Historical Provider, MD   BP 120/80 mmHg  Pulse 103  Temp(Src) 97.6 F (36.4 C) (Oral)  Resp 16  SpO2 97%  Physical Exam Physical Exam  Constitutional: oriented to person, place, and time. appears well-developed and well-nourished. No distress.  HENT:  Head: Normocephalic and atraumatic.  Eyes: EOMI. PERRL.  Neck: Normal range of motion.  Cardiovascular: RRR, no m/r/g, 2+ distal pulses,  Pulmonary/Chest: Effort normal and breath sounds normal. No respiratory distress.  Abdominal: Soft. Bowel sounds are normal. NonTTP, no distension.  Musculoskeletal: Normal range of motion. Non ttp, no effusion.  Neurological: alert and oriented to person, place, and time.  Skin: Numerous lacerations to the right forearm.  total length of lacerations 4.5 cm.  Psychiatric: normal  mood and affect. behavior is normal. Judgment and thought content normal.   ED Course  LACERATION REPAIR Date/Time: 01/26/2015 6:41 PM Performed by: Marily Memos, DAVID J Authorized by: Marily Memos, DAVID J Consent: Verbal consent obtained. Risks and benefits: risks, benefits and alternatives were discussed Consent given by: patient and parent Patient identity confirmed: verbally with patient Location: R forearm. Laceration length: 4.5 cm Foreign bodies: no foreign bodies Tendon involvement: none Nerve involvement: none Vascular damage: no Local anesthetic: lidocaine 2% with epinephrine Anesthetic total: 4 ml Preparation: Patient was prepped and draped in the usual sterile fashion. Irrigation solution: Betadine and saline. Amount of cleaning: standard Debridement: none Degree of undermining: none Skin closure: 5-0 nylon Subcutaneous closure: 4-0 Vicryl Number of sutures: 13 Technique: simple Approximation: close Approximation difficulty: complex Dressing: antibiotic ointment   (including critical care time) Labs Review Labs Reviewed - No data to display  Imaging Review Dg Forearm Right  01/26/2015   CLINICAL DATA:  Punched forearm through glass window. Forearm pain, laceration, and swelling. Initial encounter.  EXAM: RIGHT FOREARM - 2 VIEW  COMPARISON:  None.  FINDINGS: There is no evidence of fracture or other focal bone lesions. Soft tissue swelling and laceration is seen along the volar aspect of the distal forearm, however there is no evidence of radiopaque foreign body.  IMPRESSION: Distal forearm soft tissue swelling and laceration. No evidence of radiopaque foreign body or fracture.   Electronically Signed   By: Earle Gell M.D.   On: 01/26/2015 16:04     MDM   1. Laceration    There is lacerations of former repaired using subcutaneous 3-0 Vicryl's and superficial nylons. Detailed wound care structures given. Remove sutures in 10 days. No need for antibiotics. Tetanus  posterior given.    Waldemar Dickens, MD 01/26/15 [phone]

## 2015-01-26 NOTE — Discharge Instructions (Signed)
Your cuts were repaired with deep and superficial sutures. Please take the superficial sutures out in 10 days. Please apply antibiotic ointment once to twice daily until the sutures are removed.

## 2015-01-26 NOTE — ED Notes (Signed)
C/o right arm laceration States he punched the garage window today due to being mad

## 2015-02-09 ENCOUNTER — Emergency Department (HOSPITAL_COMMUNITY)
Admission: EM | Admit: 2015-02-09 | Discharge: 2015-02-09 | Disposition: A | Discharge status: Home / Self Care | Payer: 59 | Source: Home / Self Care | Attending: Family Medicine | Admitting: Family Medicine

## 2015-02-09 ENCOUNTER — Encounter (HOSPITAL_COMMUNITY): Payer: Self-pay | Admitting: Emergency Medicine

## 2015-02-09 DIAGNOSIS — S41111D Laceration without foreign body of right upper arm, subsequent encounter: Secondary | ICD-10-CM | POA: Diagnosis not present

## 2015-02-09 DIAGNOSIS — Z4802 Encounter for removal of sutures: Secondary | ICD-10-CM

## 2015-02-09 NOTE — ED Notes (Signed)
Pt is here for a f/u and to have sutures removed from right forearm... Placed on 7/15 Voices no new concerns Alert, no signs of acute distress.

## 2015-02-09 NOTE — Discharge Instructions (Signed)
Her laceration is healing well. Please apply antibiotic ointment for the next 1-2 days and then start using vitamin E ointment to help the scar soften and heal quicker.

## 2015-02-09 NOTE — ED Provider Notes (Signed)
CSN: [id number]     Arrival date & time 02/09/15  1440 History   First MD Initiated Contact with Patient 02/09/15 1644     Chief Complaint  Patient presents with  . Suture / Staple Removal   (Consider location/radiation/quality/duration/timing/severity/associated sxs/prior Treatment) HPI  Sutures placed 14 days ago for right arm laceration sustained from glass in the patient's garage. Since that time he has applied antibiotic ointment and kept the area clean. Sutures were attempted to be removed by family at home but this was unsuccessful so they have come into the clinic for removal. Denies drainage, swelling, fevers, rash. Mild tenderness to palpation of the area. Problem is constant and unchanged.   Past Medical History  Diagnosis Date  . Irritable bowel syndrome with diarrhea     no current med.  . Pityriasis lichenoides     arms and legs  . Anxiety   . Lymphadenopathy, inguinal 06/2014    right  . Hematuria, microscopic 05/2014  . Joint pain 06/13/2014    knees and legs  . Cat-scratch disease   . High serum Bartonella henselae antibody titer   . Cat-scratch disease   . Persistent testicular pain   . Skin ulcer    Past Surgical History  Procedure Laterality Date  . Ankle arthroscopy with drilling/microfracture Right 12/15/2013    Procedure: RIGHT ANKLE ARTHROSCOPY WITH MICRO FRACTURE ;  Surgeon: Wylene Simmer, MD;  Location: [place];  Service: Orthopedics;  Laterality: Right;  . Lymph node biopsy Right 06/15/2014    Procedure: RIGHT FEMORAL LYMPH NODE BIOPSY;  Surgeon: Donnie Mesa, MD;  Location: [place];  Service: General;  Laterality: Right;   Family History  Problem Relation Age of Onset  . Obesity Father   . ADD / ADHD Brother   . Depression Brother   . Mitral valve prolapse Maternal Grandmother   . Cancer Maternal Grandmother     uterine  . Dementia Maternal Grandfather   . Thyroid disease Maternal Grandfather   . Cancer  Paternal Grandfather     renal  . Hypertension Mother    History  Substance Use Topics  . Smoking status: Former Research scientist (life sciences)  . Smokeless tobacco: Never Used     Comment: mother thinks he has not smoked in a few months  . Alcohol Use: 0.0 oz/week    0 Standard drinks or equivalent per week     Comment: occasional    Review of Systems Per HPI with all other pertinent systems negative.   Allergies  Lactose intolerance (gi)  Home Medications   Prior to Admission medications   Medication Sig Start Date End Date Taking? Authorizing Provider  ALPRAZolam (XANAX XR) 1 MG 24 hr tablet Take 1 mg by mouth every morning. 11/22/14   Historical Provider, MD  Escitalopram Oxalate (LEXAPRO PO) Take 1 tablet by mouth at bedtime.    Historical Provider, MD  ibuprofen (ADVIL,MOTRIN) 800 MG tablet Take 1 tablet (800 mg total) by mouth every 8 (eight) hours as needed. 11/11/14   Liam Graham, PA-C  traMADol (ULTRAM) 50 MG tablet Take 1 tablet (50 mg total) by mouth every 6 (six) hours as needed. Patient not taking: Reported on 12/23/2014 11/11/14   Liam Graham, PA-C  TRAZODONE HCL PO Take 1 tablet by mouth at bedtime as needed (sleep).    Historical Provider, MD   BP 115/71 mmHg  Pulse 64  Temp(Src) 98.6 F (37 C) (Oral)  Resp 20  SpO2 99% Physical  Exam Physical Exam  Constitutional: oriented to person, place, and time. appears well-developed and well-nourished. No distress.  HENT:  Head: Normocephalic and atraumatic.  Eyes: EOMI. PERRL.  Neck: Normal range of motion.  Cardiovascular: RRR, no m/r/g, 2+ distal pulses,  Pulmonary/Chest: Effort normal and breath sounds normal. No respiratory distress.  Abdominal: Soft. Bowel sounds are normal. NonTTP, no distension.  Musculoskeletal: Normal range of motion. Non ttp, no effusion.  Neurological: alert and oriented to person, place, and time.  Skin: Sutures removed without complication. Skin is well-healing. Mild puffiness to the scar tissue in  part due to the length that the sutures have been in place.  Psychiatric: normal mood and affect. behavior is normal. Judgment and thought content normal.    ED Course  Procedures (including critical care time) Labs Review Labs Reviewed - No data to display  Imaging Review No results found.   MDM   1. Visit for suture removal   2. Arm laceration, right, subsequent encounter    Well-healing laceration to right arm. Scar healing instructions given.    Waldemar Dickens, MD 02/09/15 [phone]

## 2015-02-12 ENCOUNTER — Ambulatory Visit: Payer: 59 | Attending: Family Medicine | Admitting: Physical Therapy

## 2015-02-12 DIAGNOSIS — M25562 Pain in left knee: Secondary | ICD-10-CM | POA: Diagnosis not present

## 2015-02-12 DIAGNOSIS — R262 Difficulty in walking, not elsewhere classified: Secondary | ICD-10-CM | POA: Diagnosis present

## 2015-02-12 DIAGNOSIS — R29898 Other symptoms and signs involving the musculoskeletal system: Secondary | ICD-10-CM | POA: Insufficient documentation

## 2015-02-12 DIAGNOSIS — M25662 Stiffness of left knee, not elsewhere classified: Secondary | ICD-10-CM

## 2015-02-12 DIAGNOSIS — M25571 Pain in right ankle and joints of right foot: Secondary | ICD-10-CM | POA: Insufficient documentation

## 2015-02-12 NOTE — Patient Instructions (Signed)
Pt and mother given knee level 1 strengthening. Quad set, heel slide, SAQ and Ham set with handout provided   Dorsiflexion stretch with towel to Right ankle as shown in clinic  Pt/mother educated on neuro pain science and time table forhealing. By Colin Rhein PT  Voncille Lo, PT 02/12/2015 6:31 PM Phone: [phone] Fax: [phone]

## 2015-02-12 NOTE — Therapy (Signed)
[place], [place], [address] Phone: [phone]   Fax:  [phone]  Physical Therapy Evaluation  Patient Details  Name: Cody Todd MRN: [id number] Date of Birth: August 29, 1995 Referring Provider:  Dene Gentry, MD  Encounter Date: 02/12/2015      PT End of Session - 02/12/15 1500    Visit Number 1   Number of Visits 16   Date for PT Re-Evaluation 04/09/15   Authorization Type [facility]   Authorization Time Period 04-09-15   PT Start Time 1230   PT Stop Time 1345   PT Time Calculation (min) 75 min   Activity Tolerance Patient tolerated treatment well   Behavior During Therapy [facility] for tasks assessed/performed      Past Medical History  Diagnosis Date  . Irritable bowel syndrome with diarrhea     no current med.  . Pityriasis lichenoides     arms and legs  . Anxiety   . Lymphadenopathy, inguinal 06/2014    right  . Hematuria, microscopic 05/2014  . Joint pain 06/13/2014    knees and legs  . Cat-scratch disease   . High serum Bartonella henselae antibody titer   . Cat-scratch disease   . Persistent testicular pain   . Skin ulcer     Past Surgical History  Procedure Laterality Date  . Ankle arthroscopy with drilling/microfracture Right 12/15/2013    Procedure: RIGHT ANKLE ARTHROSCOPY WITH MICRO FRACTURE ;  Surgeon: Wylene Simmer, MD;  Location: [place];  Service: Orthopedics;  Laterality: Right;  . Lymph node biopsy Right 06/15/2014    Procedure: RIGHT FEMORAL LYMPH NODE BIOPSY;  Surgeon: Donnie Mesa, MD;  Location: [place];  Service: General;  Laterality: Right;    There were no vitals filed for this visit.  Visit Diagnosis:  Left knee pain  Right ankle pain  Difficulty walking  Decreased ROM of left knee      Subjective Assessment - 02/12/15 1253    Subjective Pt enters clinic with mother with complaint or left knee and Right ankle.  Pt fell  and tripped while running and made contact with ground with patella and saw Dr. Barbaraann Barthel for referral   Patient is accompained by: Family member  mother   Pertinent History Pt has had Cat Scratch disease and had arthroscopy of R ankle for 57mm osteochodral lesion with mild debridement and microfracture 12-16-14 .    How long can you sit comfortably? 5 min   How long can you stand comfortably? 5 min   How long can you walk comfortably? 5 - 10 min   Diagnostic tests x ray negative   Patient Stated Goals Be able to work and exercise without pain in left knee and R ankle   Currently in Pain? Yes   Pain Score 8   4/10 at rest sitting with knee bent   Pain Location Knee   Pain Orientation Left   Pain Descriptors / Indicators Aching   Pain Type Acute pain   Pain Onset More than a month ago   Pain Frequency Constant   Aggravating Factors  bending my left knee when standing.   walking and leaning on left knee due to Right ankle pain   Pain Relieving Factors nothing   Multiple Pain Sites Yes   Pain Score 8   Pain Location Ankle   Pain Orientation Right   Pain Descriptors / Indicators Sharp   Pain Type Chronic pain  Pain Onset More than a month ago   Pain Frequency Intermittent   Aggravating Factors  plantarflexion of foot   Pain Relieving Factors no hopping, resting foot            [facility] PT Assessment - 02/12/15 1256    Assessment   Medical Diagnosis Left knee pain and right ankle pain post surgery 1 year.   Onset Date/Surgical Date 12/13/14   Hand Dominance Right   Prior Therapy prior PT on ankle only   Precautions   Precautions None   Restrictions   Weight Bearing Restrictions No   Balance Screen   Has the patient fallen in the past 6 months Yes   How many times? 1  while running and tripped   Has the patient had a decrease in activity level because of a fear of falling?  No   Is the patient reluctant to leave their home because of a fear of falling?  No   Home Environment    Living Environment Private residence   Living Arrangements Parent   Type of [place] to enter   Home Layout Two level   Prior Function   Level of Independence Independent   Vocation Requirements student   Cognition   Overall Cognitive Status Within Functional Limits for tasks assessed   Observation/Other Assessments   Focus on Therapeutic Outcomes (FOTO)  intake 34% limitation 66% predicted 35%   Observation/Other Assessments-Edema    Edema Figure 8   Figure 8 Edema   Figure 8 - Right  52.0 cm  ankle   Figure 8 - Left  52.0 cm  ankle   Squat   Comments Pt unable to squat due to pain in left knee no greater than 60 when stanidng   Hopping   Comments unable to hop due to Right ankle pain   Single Leg Stance   Comments Left 15 sec  right  5 seconds   AROM   Right Knee Extension 0   Right Knee Flexion 148   Left Knee Extension 0   Left Knee Flexion 135   Right Ankle Dorsiflexion 2   Right Ankle Plantar Flexion 50  pain with resistance   Right Ankle Inversion 20  pain over talus   Right Ankle Eversion 10  pain over talus   Left Ankle Dorsiflexion 12   Left Ankle Plantar Flexion 55   Left Ankle Inversion 35   Left Ankle Eversion 20   Strength   Right Hip Flexion 5/5   Left Hip Flexion 5/5   Right Knee Flexion 4+/5   Right Knee Extension 4+/5   Left Knee Flexion 4+/5   Left Knee Extension 4/5   Right Ankle Dorsiflexion 4/5   Right Ankle Plantar Flexion 4/5   Left Ankle Dorsiflexion 4/5   Left Ankle Plantar Flexion 4/5   Palpation   Palpation comment Pt with no pain on palpation of Right ankle,  Pt with significant crepitus but no ligamental instability noted.  Pt with minimal pain onpalpation of left knee but functinally pt unable to bend greater than 60 degrees in standing and unable to squat.  Pt retrieves items from floot with hip hinge.   Ambulation/Gait   Ambulation/Gait Yes   Gait Pattern Decreased stance time - right;Decreased weight  shift to right;Decreased step length - left   Gait Comments Pt ambulates with R ankle everted and wt shift to left to avoid stance phase on right  PT Education - 02/12/15 1330    Education provided Yes   Education Details Pt /mother educated on knee strength level 1 for left LE and DF stretch for R ankle and explanation of finding.  Also educated on pain science education and time line of healing from surgical procedures.    Person(s) Educated Patient;Parent(s)   Methods Explanation;Demonstration;Verbal cues;Tactile cues   Comprehension Verbalized understanding;Returned demonstration;Need further instruction          PT Short Term Goals - 02/12/15 1844    PT SHORT TERM GOAL #1   Title "Independent with initial HEP   Time 4   Period Weeks   Status New   PT SHORT TERM GOAL #2   Title "Report pain decrease at rest from   /10 to   /10.   Time 4   Period Weeks   Status New   PT SHORT TERM GOAL #3   Title "Demonstrate and verbalize understanding of condition management including RICE, positioning, use of A.D., HEP.    Time 4   Period Weeks   Status New   PT SHORT TERM GOAL #4   Title Pt will have symmetrical knee AROM of R and left knee with minimal pain 3/10 or less   Time 4   Period Weeks   Status New           PT Long Term Goals - 02/12/15 1848    PT LONG TERM GOAL #1   Title "Pt will be independent with advanced HEP.    Time 8   Period Weeks   Status New   PT LONG TERM GOAL #2   Title "Pain will decrease to 2/10 or less with all functional activities    Time 8   Period Weeks   Status New   PT LONG TERM GOAL #3   Title Pt will be able to tolerate walking and standing for at least 2 hours with minimal  painin order to participate in job/recreational activities   Time 8   Period Weeks   Status New   PT LONG TERM GOAL #4   Title "FOTO will improve from 66%   to   35% limitation  indicating improved functional mobility .     Time 8   Period Weeks   Status New   PT LONG TERM GOAL #5   Title Pt will be able increase strength in bil LE's in order to participate in plyometric drills in order to return to recreational pursuits    Time 8   Period Weeks   Status New               Plan - 02/12/15 1854    Clinical Impression Statement 19 yo post Right ankle arthroscopy 12-15-13 for anteromedio tenosynovitis and osteochondral defect of R talar dome,  Pt with residual pain and inability to participate in sports or a job due to pain in Right ankle and Left knee.  Pt  fell while running and tripped around 12-13-14  and with residual pain  globally on anterior knee but nothing specifit.  Pt /mother indicate he has much anxiety about the possibility of arthirits developing in R ankle or the possibiility of  pt needing a  fusion of R ankle joint  for pain .  Pt /mother had extensive education about  current pain science theory.  Pt would benefit from skilled PT for AROM, strengtheing, and progressive plyometrics as pt tolerates to encourage more activity.  Pt gait with externally  rotated Right ankle with decreased stance time on Right.  Will continue for Core conditioning and strengthening to return to active lifestyle as pt tolerates.   Pt will benefit from skilled therapeutic intervention in order to improve on the following deficits Abnormal gait;Decreased activity tolerance;Decreased mobility;Decreased range of motion;Pain;Decreased strength;Increased fascial restricitons   Rehab Potential Good   PT Frequency 2x / week   PT Duration 8 weeks   PT Treatment/Interventions ADLs/Self Care Home Management;Cryotherapy;Moist Heat;Electrical Stimulation;Iontophoresis 4mg /ml Dexamethasone;Gait training;Neuromuscular re-education;Therapeutic exercise;Functional mobility training;Manual techniques;Taping;Dry needling;Balance training;Ultrasound   PT Next Visit Plan modalities for pain and progressive strengthening of LE, possible use  of bioflex figure 8 ankle brace   PT Home Exercise Plan knee level 1 strengthening for left and DF stretch for R LE   Consulted and Agree with Plan of Care Patient;Family member/caregiver         Problem List Patient Active Problem List   Diagnosis Date Noted  . Left knee pain 01/18/2015  . Dysphagia, pharyngoesophageal phase 10/11/2014  . Brown recluse spider bite 08/16/2014  . 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 Persistent testicular pain   . Skin ulcer   . Cat-scratch disease   . High serum Bartonella henselae antibody titer   . Lymphadenitis 07/05/2014  . Inguinal lymphadenopathy 07/05/2014  . Granuloma due to infection 07/05/2014  . Right ankle pain 11/07/2013  . Bilateral foot pain 01/03/2013  . Lactose intolerance 06/09/2012  . Anxiety 05/27/2012  . Multiple somatic complaints 04/08/2012  . Irritable bowel syndrome with diarrhea 03/29/2012  . Other disorders of synovium, tendon, and bursa(727.89) 04/25/2011  . Pityriasis lichenoides 14/97/0263  . RUQ PAIN 10/09/2009   Voncille Lo, PT 02/12/2015 7:09 PM Phone: [phone] Fax: [phone]  By signing I understand that I am ordering/authorizing the use of Iontophoresis using 4 mg/mL of dexamethasone as a component of this plan of care. [place], [place], [address] Phone: [phone]   Fax:  [phone]

## 2015-02-27 ENCOUNTER — Ambulatory Visit: Payer: 59 | Admitting: Physical Therapy

## 2015-03-05 ENCOUNTER — Ambulatory Visit: Payer: 59 | Admitting: Physical Therapy

## 2015-03-05 DIAGNOSIS — M25562 Pain in left knee: Secondary | ICD-10-CM | POA: Diagnosis not present

## 2015-03-05 DIAGNOSIS — R262 Difficulty in walking, not elsewhere classified: Secondary | ICD-10-CM

## 2015-03-05 DIAGNOSIS — M25571 Pain in right ankle and joints of right foot: Secondary | ICD-10-CM

## 2015-03-05 DIAGNOSIS — M25662 Stiffness of left knee, not elsewhere classified: Secondary | ICD-10-CM

## 2015-03-05 NOTE — Therapy (Signed)
[place]ody Todd, [place], [address] Phone: [phone]   Fax:  [phone]  Physical Therapy Treatment  Patient Details  Name: Cody Todd MRN: [id number] Date of Birth: 1996-07-12 Referring Provider:  Pediatrics, [place]  Encounter Date: 03/05/2015      PT End of Session - 03/05/15 1437    Visit Number 2   Number of Visits 16   Date for PT Re-Evaluation 04/09/15   PT Start Time 4287  pt late   PT Stop Time 1524   PT Time Calculation (min) 59 min   Activity Tolerance Patient tolerated treatment well      Past Medical History  Diagnosis Date  . Irritable bowel syndrome with diarrhea     no current med.  . Pityriasis lichenoides     arms and legs  . Anxiety   . Lymphadenopathy, inguinal 06/2014    right  . Hematuria, microscopic 05/2014  . Joint pain 06/13/2014    knees and legs  . Cat-scratch disease   . High serum Bartonella henselae antibody titer   . Cat-scratch disease   . Persistent testicular pain   . Skin ulcer     Past Surgical History  Procedure Laterality Date  . Ankle arthroscopy with drilling/microfracture Right 12/15/2013    Procedure: RIGHT ANKLE ARTHROSCOPY WITH MICRO FRACTURE ;  Surgeon: Cody Simmer, MD;  Location: [place];  Service: Orthopedics;  Laterality: Right;  . Lymph node biopsy Right 06/15/2014    Procedure: RIGHT FEMORAL LYMPH NODE BIOPSY;  Surgeon: Cody Mesa, MD;  Location: [place];  Service: General;  Laterality: Right;    There were no vitals filed for this visit.  Visit Diagnosis:  Left knee pain  Right ankle pain  Difficulty walking  Decreased ROM of left knee      Subjective Assessment - 03/05/15 1430    Subjective Reports doing HEP regularly. Pain still increases with 7/10-8/10 with standing >10 min    Currently in Pain? Yes   Pain Score 2    Pain Location Knee   Pain Orientation Left   Pain Type Acute pain    Pain Onset More than a month ago   Pain Frequency Constant   Pain Score 3   Pain Location Ankle  feels like its loose   Pain Orientation Right   Pain Type Chronic pain   Pain Onset More than a month ago   Pain Frequency Intermittent            [facility] PT Assessment - 03/05/15 1458    AROM   Right Knee Flexion 154   Left Knee Flexion 140  150 PROM with pain               [facility] Adult PT Treatment/Exercise - 03/05/15 1503    Exercises   Exercises Ankle   Knee/Hip Exercises: Aerobic   Stepper 18min, 32 floors   Knee/Hip Exercises: Standing   Forward Step Up Left;1 set;15 reps;Hand Hold: 1;Step Height: 4"   Step Down Left;1 set;15 reps;Hand Hold: 0;Step Height: 4"   Wall Squat 2 sets;10 reps   Other Standing Knee Exercises single leg balance with rebounder Rt. LE facing forward and facing L side and R side   Other Standing Knee Exercises red band standing hip abduction and adduction 2 sets x12/each   Knee/Hip Exercises: Supine   Quad Sets Strengthening;Left;1 set   Straight Leg Raises Strengthening;Both;1 set;10 reps   Straight Leg Raise  with External Rotation Strengthening;Left;1 set;20 reps   Ankle Exercises: Seated   Other Seated Ankle Exercises Theraband ex all planes x 10 blue theraband       ice pack L knee 10 min        PT Short Term Goals - 03/05/15 1438    PT SHORT TERM GOAL #1   Title "Independent with initial HEP   Status Achieved   PT SHORT TERM GOAL #2   Title "Report pain decrease at rest from  5/10 to  2 /10.in ankle and knee   Status Revised   PT SHORT TERM GOAL #3   Title "Demonstrate and verbalize understanding of condition management including RICE, positioning, use of A.D., HEP.    Status On-going   PT SHORT TERM GOAL #4   Title Pt will have symmetrical knee AROM of R and left knee with minimal pain 3/10 or less   Status On-going           PT Long Term Goals - 03/05/15 1439    PT LONG TERM GOAL #1   Title "Pt will be independent  with advanced HEP.    Status On-going   PT LONG TERM GOAL #2   Title "Pain will decrease to 2/10 or less with all functional activities    Status On-going   PT LONG TERM GOAL #3   Title Pt will be able to tolerate walking and standing for at least 2 hours with minimal pain in order to participate in job/recreational activities   Status On-going   PT LONG TERM GOAL #4   Title "FOTO will improve from 66%   to   35% limitation  indicating improved functional mobility .    Status On-going   PT LONG TERM GOAL #5   Title Pt will be able increase strength in bil LE's in order to participate in plyometric drills in order to return to recreational pursuits    Status On-going               Plan - 03/05/15 1521    Clinical Impression Statement Cody Todd tolerated exercise well today, he continues with abnormal alignment, pain and crepitus in L knee.  Will cont with POC   PT Next Visit Plan modalities for pain and progressive strengthening of LE, possible use of bioflex figure 8 ankle brace   PT Home Exercise Plan knee level 1 strengthening for left and DF stretch for R LE added SLR and wall sit   Consulted and Agree with Plan of Care Patient        Problem List Patient Active Problem List   Diagnosis Date Noted  . Left knee pain 01/18/2015  . Dysphagia, pharyngoesophageal phase 10/11/2014  . Brown recluse spider bite 08/16/2014  . 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 Persistent testicular pain   . Skin ulcer   . Cat-scratch disease   . High serum Bartonella henselae antibody titer   . Lymphadenitis 07/05/2014  . Inguinal lymphadenopathy 07/05/2014  . Granuloma due to infection 07/05/2014  . Right ankle pain 11/07/2013  . Bilateral foot pain 01/03/2013  . Lactose intolerance 06/09/2012  . Anxiety 05/27/2012  . Multiple somatic complaints 04/08/2012  . Irritable bowel syndrome with diarrhea 03/29/2012  . Other disorders of synovium, tendon, and bursa(727.89) 04/25/2011  .  Pityriasis lichenoides 83/41/9622  . RUQ PAIN 10/09/2009    Cody Todd 03/05/2015, 3:27 PM  [facility] [address] [place], [place], [address] Phone: [phone]   Fax:  [place], PT 03/05/2015 3:27 PM Phone: [phone] Fax: [phone]

## 2015-03-05 NOTE — Patient Instructions (Signed)
HIP: Flexion / KNEE: Extension, Straight Leg Raise   Raise leg, keeping knee straight. Perform slowly. _10__ reps per set, __2_ sets per day, _5-6__ days per week Also perform with toe pointed out should feel on the inside of thighAdvance Knee Strengthener   Leaning on wall, bend knees and slowly lower body until legs form a 90 angle. Return. Inhale while lowering, and exhale while raising the body. Repeat 10 times. Do 2 sessions per day. Keep knees pushed out do not let them collapse in on each other. Make sure toes stay pointed forward in neutral position not pointed outward.  http://gt2.exer.us/271   Copyright  VHI. All rights reserved.     Copyright  VHI. All rights reserved.

## 2015-03-07 ENCOUNTER — Ambulatory Visit: Payer: 59 | Admitting: Physical Therapy

## 2015-03-12 ENCOUNTER — Ambulatory Visit: Payer: 59 | Admitting: Physical Therapy

## 2015-03-12 DIAGNOSIS — R262 Difficulty in walking, not elsewhere classified: Secondary | ICD-10-CM

## 2015-03-12 DIAGNOSIS — M25662 Stiffness of left knee, not elsewhere classified: Secondary | ICD-10-CM

## 2015-03-12 DIAGNOSIS — M25571 Pain in right ankle and joints of right foot: Secondary | ICD-10-CM

## 2015-03-12 DIAGNOSIS — M25562 Pain in left knee: Secondary | ICD-10-CM | POA: Diagnosis not present

## 2015-03-12 NOTE — Patient Instructions (Signed)
Isometric ankle from drawer

## 2015-03-12 NOTE — Therapy (Signed)
[place], [place], [address] Phone: [phone]   Fax:  [phone]  Physical Therapy Treatment  Patient Details  Name: Cody Todd MRN: [id number] Date of Birth: 1995-12-10 Referring Provider:  Pediatrics, [place]  Encounter Date: 03/12/2015      PT End of Session - 03/12/15 1737    Visit Number 3   Number of Visits 16   Date for PT Re-Evaluation 04/09/15   PT Start Time 1630   PT Stop Time 1725   PT Time Calculation (min) 55 min   Activity Tolerance Patient tolerated treatment well   Behavior During Therapy [facility] for tasks assessed/performed      Past Medical History  Diagnosis Date  . Irritable bowel syndrome with diarrhea     no current med.  . Pityriasis lichenoides     arms and legs  . Anxiety   . Lymphadenopathy, inguinal 06/2014    right  . Hematuria, microscopic 05/2014  . Joint pain 06/13/2014    knees and legs  . Cat-scratch disease   . High serum Bartonella henselae antibody titer   . Cat-scratch disease   . Persistent testicular pain   . Skin ulcer     Past Surgical History  Procedure Laterality Date  . Ankle arthroscopy with drilling/microfracture Right 12/15/2013    Procedure: RIGHT ANKLE ARTHROSCOPY WITH MICRO FRACTURE ;  Surgeon: Wylene Simmer, MD;  Location: [place];  Service: Orthopedics;  Laterality: Right;  . Lymph node biopsy Right 06/15/2014    Procedure: RIGHT FEMORAL LYMPH NODE BIOPSY;  Surgeon: Donnie Mesa, MD;  Location: [place];  Service: General;  Laterality: Right;    There were no vitals filed for this visit.  Visit Diagnosis:  Left knee pain  Right ankle pain  Difficulty walking  Decreased ROM of left knee      Subjective Assessment - 03/12/15 1628    Subjective Knee feels good.  Lt has not had any real pain in last 4 -5 days.  Ankle up to 8-9/10.  Does his exercises all the time.   Currently in Pain?  No/denies   Pain Orientation Left   Pain Frequency Intermittent   Pain Score 6  up to 8-9   Pain Location Ankle   Aggravating Factors  walking more than 2 minutes.                           [place] [facility] Adult PT Treatment/Exercise - 03/12/15 1655    Ambulation/Gait   Ambulation/Gait Yes   Assistive device Straight cane   Gait Comments less pain with cane   Knee/Hip Exercises: Supine   Quad Sets 10 reps   Patellar Mobs checked non tight.     Manual Therapy   Manual Therapy Taping;Other (comment)   Mulligan fibular a/p   Kinesiotex Ligament Correction  mobilization with movement, I band kinesiotexmid foot   Ankle Exercises: Seated   Other Seated Ankle Exercises isometric ankle 4 way added to home exercise.                PT Education - 03/12/15 1737    Education provided Yes   Education Details Isometric ankle from drawer   Person(s) Educated Patient   Methods Explanation;Demonstration;Verbal cues;Handout   Comprehension Verbalized understanding;Returned demonstration          PT Short Term Goals - 03/12/15 1740    PT SHORT TERM GOAL #1  Title "Independent with initial HEP   Status Achieved   PT SHORT TERM GOAL #2   Title "Report pain decrease at rest from  5/10 to  2 /10.in ankle and knee   Time 4   Period Weeks   Status On-going   PT SHORT TERM GOAL #3   Title "Demonstrate and verbalize understanding of condition management including RICE, positioning, use of A.D., HEP.    Baseline trial cane today.  Understands RICE    Time 4   Period Weeks   Status Achieved   PT SHORT TERM GOAL #4   Title Pt will have symmetrical knee AROM of R and left knee with minimal pain 3/10 or less   Baseline ROM = pain LT 3/10   Time 4   Period Weeks   Status Achieved           PT Long Term Goals - 03/05/15 1439    PT LONG TERM GOAL #1   Title "Pt will be independent with advanced HEP.    Status On-going   PT LONG TERM GOAL #2   Title "Pain will  decrease to 2/10 or less with all functional activities    Status On-going   PT LONG TERM GOAL #3   Title Pt will be able to tolerate walking and standing for at least 2 hours with minimal pain in order to participate in job/recreational activities   Status On-going   PT LONG TERM GOAL #4   Title "FOTO will improve from 66%   to   35% limitation  indicating improved functional mobility .    Status On-going   PT LONG TERM GOAL #5   Title Pt will be able increase strength in bil LE's in order to participate in plyometric drills in order to return to recreational pursuits    Status On-going               Plan - 03/12/15 1738    Clinical Impression Statement Lt knee pain resolving.  ROM goal met for knee.  Ankle pain RT increasing.  Less pain post session.     PT Next Visit Plan assess tape and teach techniques.  Review isometrics.   PT Home Exercise Plan isometric   Consulted and Agree with Plan of Care Patient        Problem List Patient Active Problem List   Diagnosis Date Noted  . Left knee pain 01/18/2015  . Dysphagia, pharyngoesophageal phase 10/11/2014  . Brown recluse spider bite 08/16/2014  . 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 Persistent testicular pain   . Skin ulcer   . Cat-scratch disease   . High serum Bartonella henselae antibody titer   . Lymphadenitis 07/05/2014  . Inguinal lymphadenopathy 07/05/2014  . Granuloma due to infection 07/05/2014  . Right ankle pain 11/07/2013  . Bilateral foot pain 01/03/2013  . Lactose intolerance 06/09/2012  . Anxiety 05/27/2012  . Multiple somatic complaints 04/08/2012  . Irritable bowel syndrome with diarrhea 03/29/2012  . Other disorders of synovium, tendon, and bursa(727.89) 04/25/2011  . Pityriasis lichenoides 76/28/3151  . RUQ PAIN 10/09/2009    [facility] 03/12/2015, 5:43 PM  [facility] [address] [place], [place], [address] Phone: [phone]   Fax:   [phone]     Melvenia Needles, PTA 03/12/2015 5:43 PM Phone: [phone] Fax: [phone]

## 2015-03-14 ENCOUNTER — Ambulatory Visit: Payer: 59 | Admitting: Physical Therapy

## 2015-03-18 IMAGING — US US ART/VEN ABD/PELV/SCROTUM DOPPLER LTD
1 series · 14 of 25 positions shown · non-contrast
Comparison: None.

CLINICAL DATA: Left testicle pain.

EXAM:
SCROTAL ULTRASOUND
DOPPLER ULTRASOUND OF THE TESTICLES
TECHNIQUE: Complete ultrasound examination of the testicles, epididymis, and
other scrotal structures was performed. Color and spectral Doppler
ultrasound were also utilized to evaluate blood flow to the
testicles.

[Series 1: us art/ven abd/pelv/scrotum doppler ltd · 0.07mm/px · 14 of 36 slices shown]
[im 1/36]
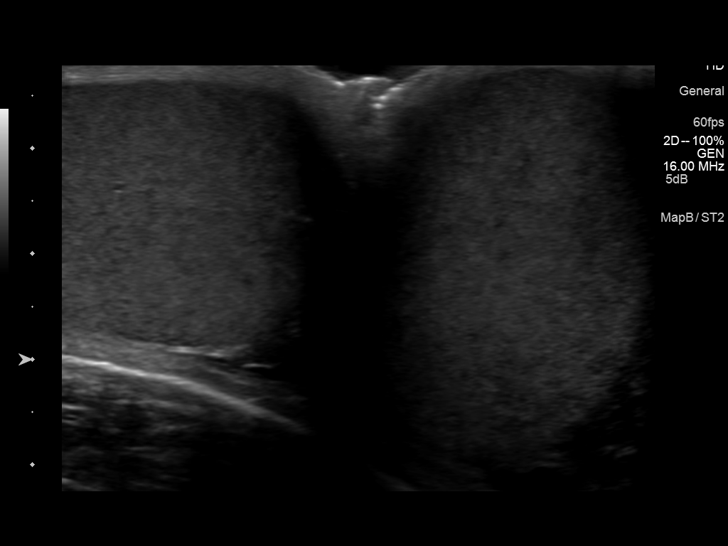
[im 3/36]
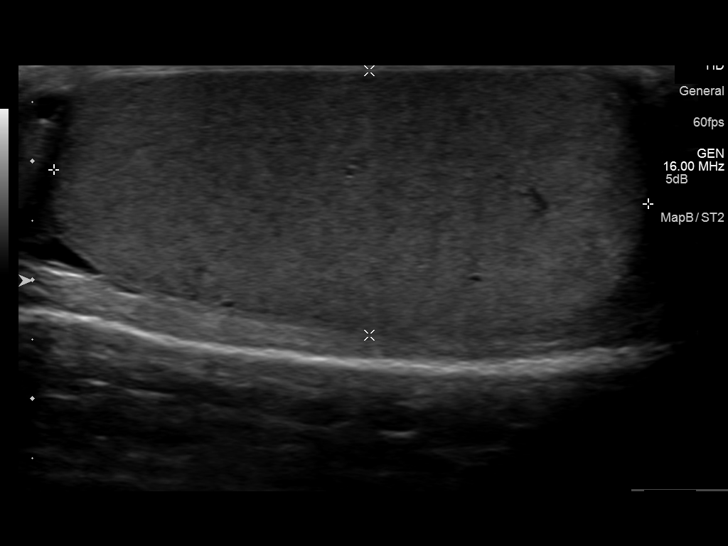
[im 6/36]
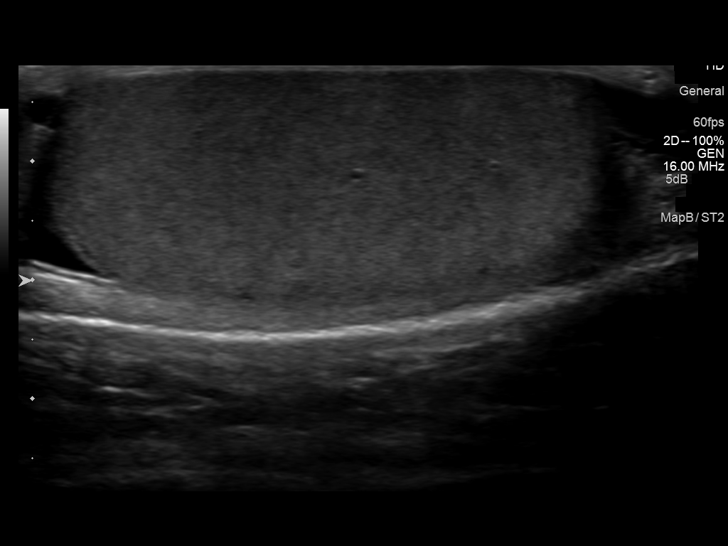
[im 9/36]
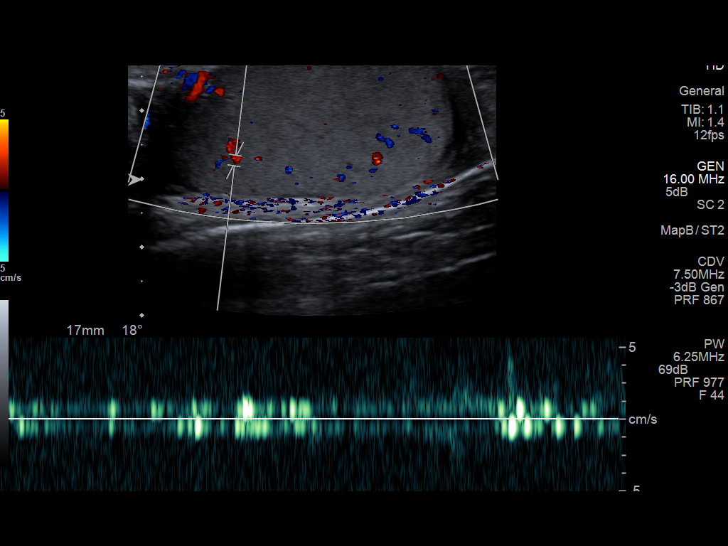
[im 12/36]
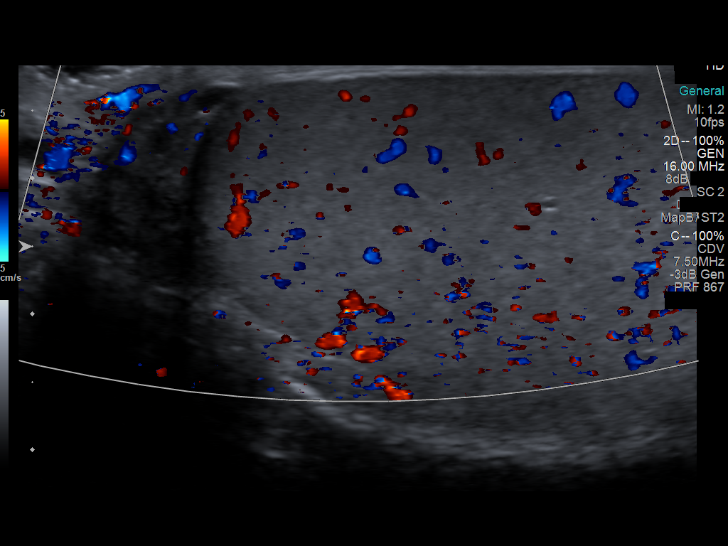
[im 14/36]
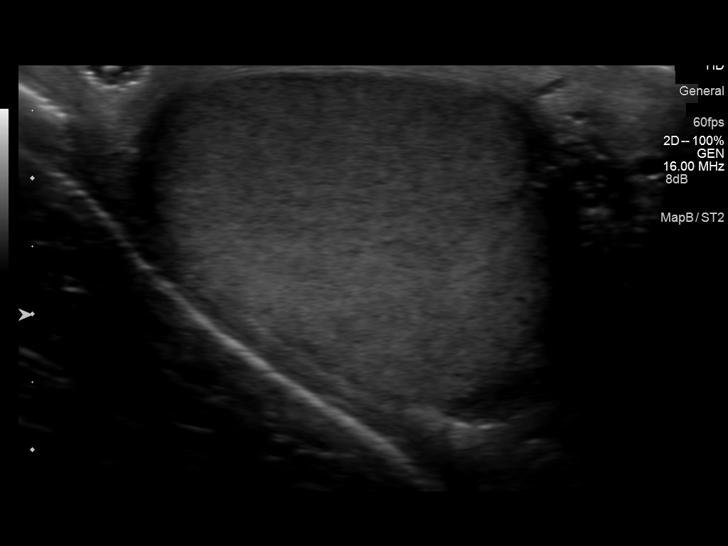
[im 17/36]
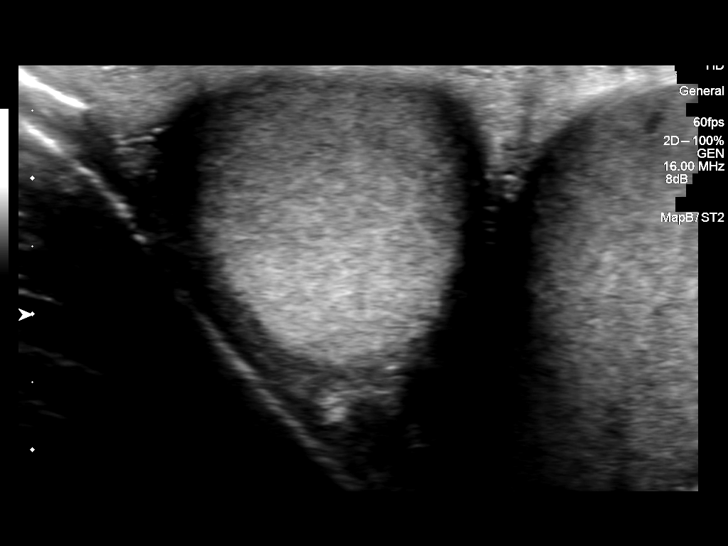
[im 19/36]
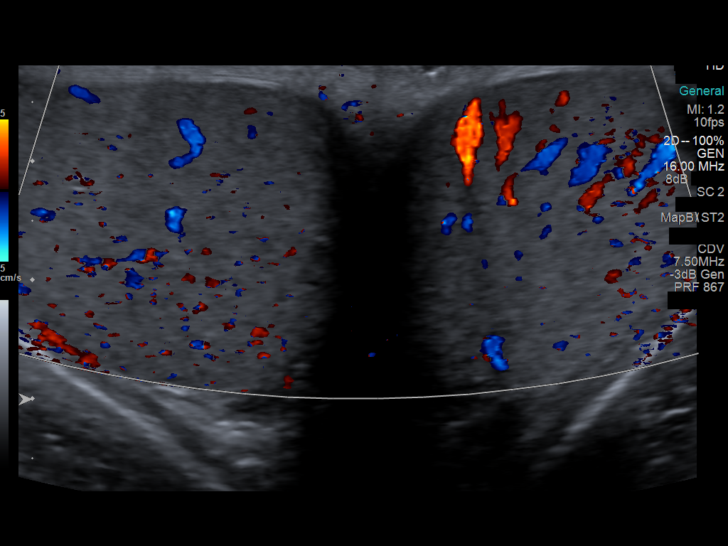
[im 22/36]
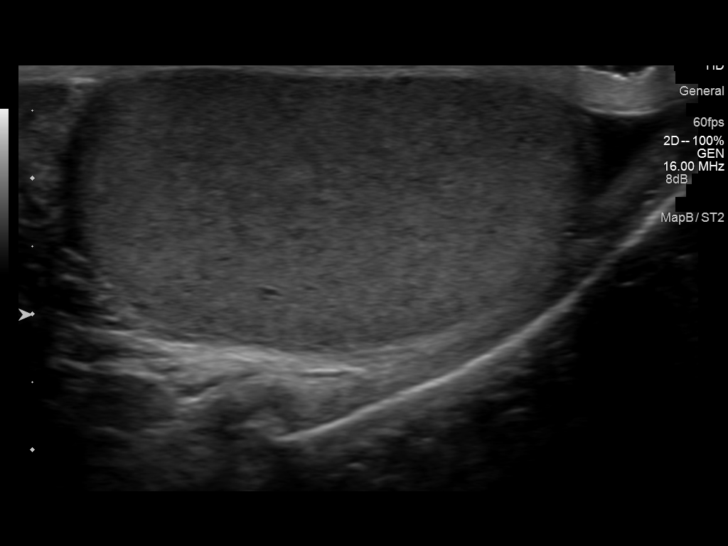
[im 24/36]
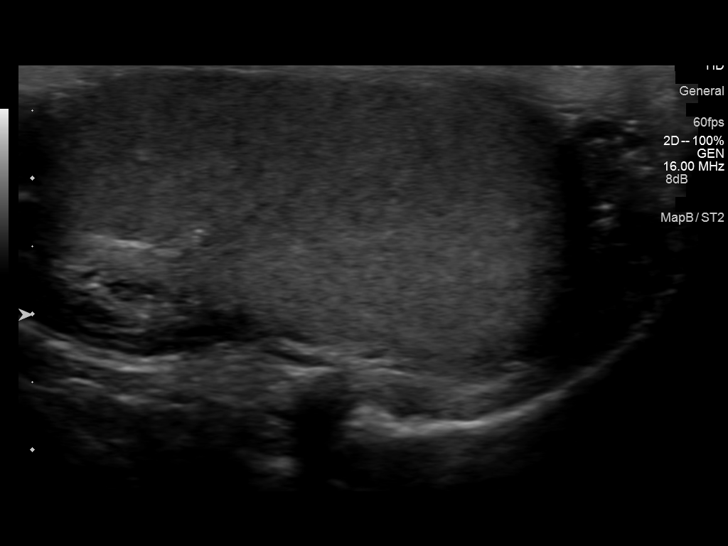
[im 27/36]
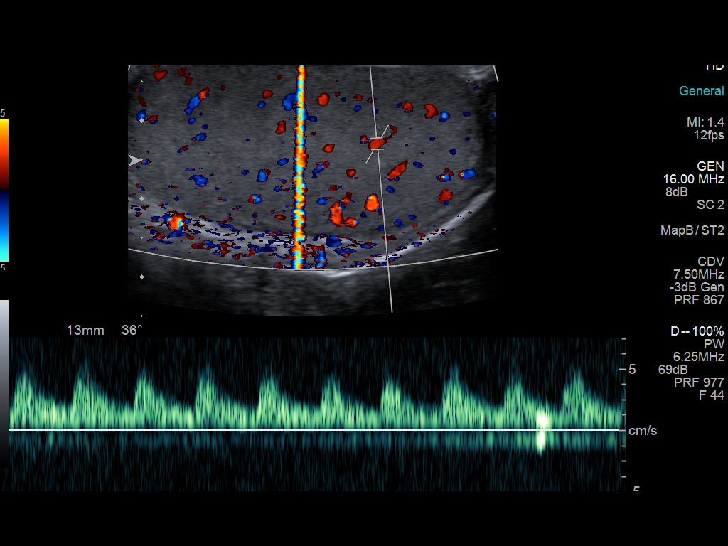
[im 30/36]
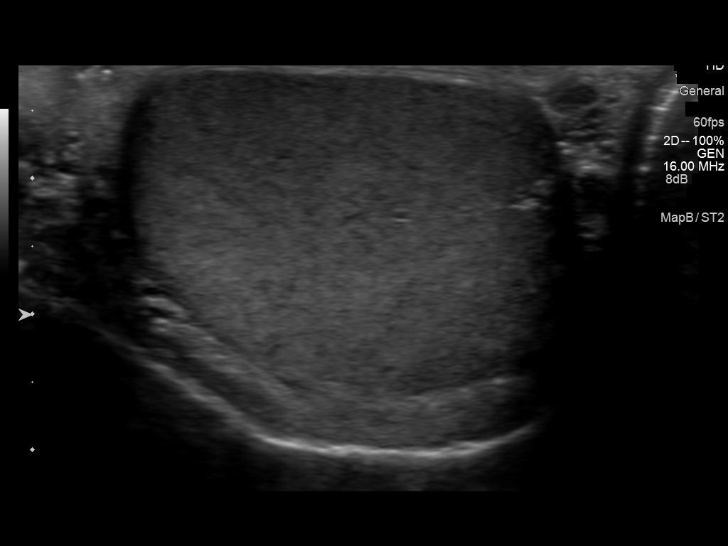
[im 33/36]
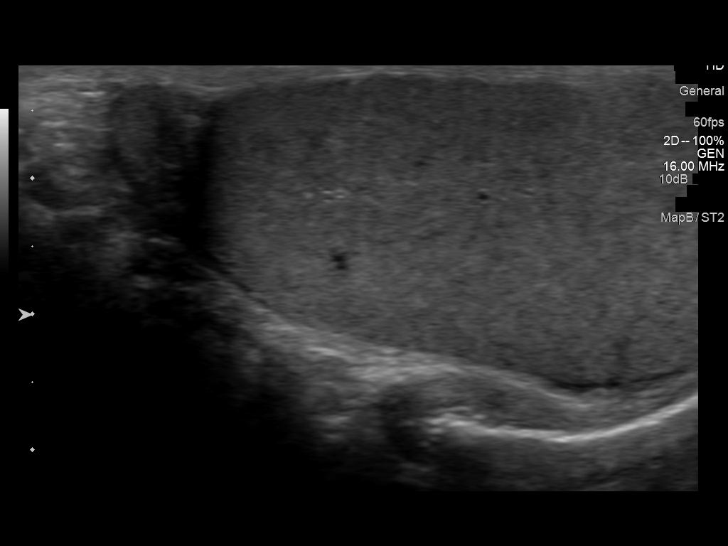
[im 36/36]
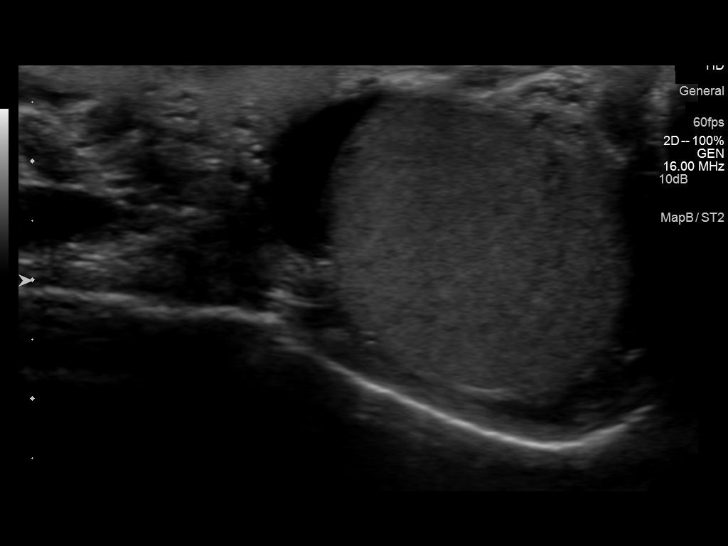

[14 of 25 positions shown; findings below may reference images not displayed]

FINDINGS: Pulsed Doppler interrogation of both testes demonstrates low
resistance arterial and venous waveforms bilaterally.

Right testicle

Measurements: 5.0 x 2.2 x 3.0 cm. No mass or microlithiasis
visualized.

Left testicle

Measurements: 4.6 x 2.3 x 3.3 cm. No mass or microlithiasis
visualized.

Right epididymis:  Normal in size and appearance.

Left epididymis:  Normal in size and appearance.

Hydrocele:  Small bilateral hydroceles.
IMPRESSION: Small bilateral hydroceles. Otherwise normal. Normal perfusion to
both testicles.

## 2015-03-20 ENCOUNTER — Ambulatory Visit: Payer: 59 | Attending: Family Medicine | Admitting: Physical Therapy

## 2015-03-20 DIAGNOSIS — R29898 Other symptoms and signs involving the musculoskeletal system: Secondary | ICD-10-CM | POA: Diagnosis present

## 2015-03-20 DIAGNOSIS — M25571 Pain in right ankle and joints of right foot: Secondary | ICD-10-CM | POA: Diagnosis present

## 2015-03-20 DIAGNOSIS — R262 Difficulty in walking, not elsewhere classified: Secondary | ICD-10-CM | POA: Diagnosis not present

## 2015-03-20 DIAGNOSIS — M25662 Stiffness of left knee, not elsewhere classified: Secondary | ICD-10-CM

## 2015-03-20 DIAGNOSIS — M25562 Pain in left knee: Secondary | ICD-10-CM | POA: Insufficient documentation

## 2015-03-20 NOTE — Therapy (Signed)
[place], [place], [address] Phone: [phone]   Fax:  [phone]  Physical Therapy Treatment  Patient Details  Name: Cody Todd MRN: [id number] Date of Birth: 03/12/1996 Referring Provider:  Pediatrics, [place]  Encounter Date: 03/20/2015      PT End of Session - 03/20/15 1806    Visit Number 4   Number of Visits 16   Date for PT Re-Evaluation 04/09/15   Authorization Type [facility]   Authorization Time Period 04-09-15   PT Start Time 0308   PT Stop Time 0355   PT Time Calculation (min) 47 min   Activity Tolerance Patient tolerated treatment well   Behavior During Therapy [facility] for tasks assessed/performed      Past Medical History  Diagnosis Date  . Irritable bowel syndrome with diarrhea     no current med.  . Pityriasis lichenoides     arms and legs  . Anxiety   . Lymphadenopathy, inguinal 06/2014    right  . Hematuria, microscopic 05/2014  . Joint pain 06/13/2014    knees and legs  . Cat-scratch disease   . High serum Bartonella henselae antibody titer   . Cat-scratch disease   . Persistent testicular pain   . Skin ulcer     Past Surgical History  Procedure Laterality Date  . Ankle arthroscopy with drilling/microfracture Right 12/15/2013    Procedure: RIGHT ANKLE ARTHROSCOPY WITH MICRO FRACTURE ;  Surgeon: Wylene Simmer, MD;  Location: [place];  Service: Orthopedics;  Laterality: Right;  . Lymph node biopsy Right 06/15/2014    Procedure: RIGHT FEMORAL LYMPH NODE BIOPSY;  Surgeon: Donnie Mesa, MD;  Location: [place];  Service: General;  Laterality: Right;    There were no vitals filed for this visit.  Visit Diagnosis:  Difficulty walking  Right ankle pain  Left knee pain  Decreased ROM of left knee      Subjective Assessment - 03/20/15 1516    Subjective " my knee and ankle hurt alot". Pt stated last session was "not good'. He now feels as  if he is going backwards with the pain he now has instead of progressing. He stated prior to last session his knee pain was pretty much gone he was only concerned about his ankle and now both hurt again.   Currently in Pain? Yes   Pain Score 5    Pain Location Knee   Pain Orientation Left   Pain Descriptors / Indicators Aching   Pain Type Chronic pain   Pain Radiating Towards just in the knee   Pain Onset More than a month ago   Pain Frequency Intermittent   Aggravating Factors  walking, sit to stand   Pain Relieving Factors ice   Effect of Pain on Daily Activities not assesed   Multiple Pain Sites Yes   Pain Score 7   Pain Location Ankle   Pain Orientation Right   Pain Descriptors / Indicators Aching  popping   Pain Type Chronic pain   Pain Radiating Towards just in ankle   Pain Onset More than a month ago   Pain Frequency Intermittent   Aggravating Factors  when it pops it hurts more, walking   Pain Relieving Factors ice   Effect of Pain on Daily Activities not assesed               [facility] Adult PT Treatment/Exercise - 03/20/15 1757    Neuro Re-ed  Neuro Re-ed Details  SL heel raises on green foam, 2 hand hold x20 reps. dynadisk clockwise and counter closkwise x10/each, 2 hand hold, SL stance on green foam performing hamstring curls on L LE with 4# ankle weight x15, one hand hold   Knee/Hip Exercises: Aerobic   Elliptical 18min warm up   Knee/Hip Exercises: Standing   Heel Raises Both;1 set;20 reps   Wall Squat 1 set;20 reps   Knee/Hip Exercises: Seated   Long Arc Quad Strengthening;Left;1 set;20 reps;Weights   Long Arc Quad Weight 4 lbs.   Modalities   Modalities Cryotherapy   Cryotherapy   Number Minutes Cryotherapy 15 Minutes   Cryotherapy Location Knee;Ankle   Type of Cryotherapy Ice pack;Other (comment)  ice pack to ankle, game ready to knee   Ankle Exercises: Aerobic   Elliptical stepper machine for 20 floors                PT Education -  03/20/15 1805    Education provided No          PT Short Term Goals - 03/12/15 1740    PT SHORT TERM GOAL #1   Title "Independent with initial HEP   Status Achieved   PT SHORT TERM GOAL #2   Title "Report pain decrease at rest from  5/10 to  2 /10.in ankle and knee   Time 4   Period Weeks   Status On-going   PT SHORT TERM GOAL #3   Title "Demonstrate and verbalize understanding of condition management including RICE, positioning, use of A.D., HEP.    Baseline trial cane today.  Understands RICE    Time 4   Period Weeks   Status Achieved   PT SHORT TERM GOAL #4   Title Pt will have symmetrical knee AROM of R and left knee with minimal pain 3/10 or less   Baseline ROM = pain LT 3/10   Time 4   Period Weeks   Status Achieved           PT Long Term Goals - 03/05/15 1439    PT LONG TERM GOAL #1   Title "Pt will be independent with advanced HEP.    Status On-going   PT LONG TERM GOAL #2   Title "Pain will decrease to 2/10 or less with all functional activities    Status On-going   PT LONG TERM GOAL #3   Title Pt will be able to tolerate walking and standing for at least 2 hours with minimal pain in order to participate in job/recreational activities   Status On-going   PT LONG TERM GOAL #4   Title "FOTO will improve from 66%   to   35% limitation  indicating improved functional mobility .    Status On-going   PT LONG TERM GOAL #5   Title Pt will be able increase strength in bil LE's in order to participate in plyometric drills in order to return to recreational pursuits    Status On-going               Plan - 03/20/15 1806    Clinical Impression Statement Pt c/o pain in knee and ankle today. He is having increased amounts of popping in his ankle that is causing him alot of pain and he is concerned about this. He was not pleased with his last session and now feels like it set him back. He did however, like the tape. Pt requests using the stair stepper but it  alleviates his pain " for some reason I felt good when I got home after being on that". Pt tolerated all standing exercises well on uneven BOS. If patient continues to attend all therapy sessions he is capable of meeting all goals and learning appropriate pain management strategies upon d/c.    PT Next Visit Plan standing dynamic exercises, closed chain ankle and knee strengthening, stair stepper, game ready per patient request   PT Home Exercise Plan no additional added   Consulted and Agree with Plan of Care Patient        Problem List Patient Active Problem List   Diagnosis Date Noted  . Left knee pain 01/18/2015  . Dysphagia, pharyngoesophageal phase 10/11/2014  . Brown recluse spider bite 08/16/2014  . 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 Persistent testicular pain   . Skin ulcer   . Cat-scratch disease   . High serum Bartonella henselae antibody titer   . Lymphadenitis 07/05/2014  . Inguinal lymphadenopathy 07/05/2014  . Granuloma due to infection 07/05/2014  . Right ankle pain 11/07/2013  . Bilateral foot pain 01/03/2013  . Lactose intolerance 06/09/2012  . Anxiety 05/27/2012  . Multiple somatic complaints 04/08/2012  . Irritable bowel syndrome with diarrhea 03/29/2012  . Other disorders of synovium, tendon, and bursa(727.89) 04/25/2011  . Pityriasis lichenoides 57/32/2025  . RUQ PAIN 10/09/2009  Radonna Ricker, SPT  Radonna Ricker 03/20/2015, 6:14 PM  [facility] [address] [place], [place], [address] Phone: [phone]   Fax:  [phone]   Raeford Razor, PT 03/21/2015 7:57 AM Phone: [phone] Fax: [phone]

## 2015-03-22 ENCOUNTER — Ambulatory Visit: Payer: 59 | Admitting: Physical Therapy

## 2015-03-22 VITALS — BP 100/70 | HR 114

## 2015-03-22 DIAGNOSIS — R262 Difficulty in walking, not elsewhere classified: Secondary | ICD-10-CM

## 2015-03-22 DIAGNOSIS — M25571 Pain in right ankle and joints of right foot: Secondary | ICD-10-CM

## 2015-03-22 DIAGNOSIS — M25562 Pain in left knee: Secondary | ICD-10-CM

## 2015-03-22 DIAGNOSIS — M25662 Stiffness of left knee, not elsewhere classified: Secondary | ICD-10-CM

## 2015-03-22 NOTE — Therapy (Signed)
[place], [place], [address] Phone: [phone]   Fax:  [phone]  Physical Therapy Treatment  Patient Details  Name: Cody Todd MRN: [id number] Date of Birth: [birth year] Referring Provider:  Pediatrics, [place]  Encounter Date: 03/22/2015      PT End of Session - 03/22/15 1514    Visit Number 5   Number of Visits 16   Date for PT Re-Evaluation 04/09/15   PT Start Time 1510  pt late   PT Stop Time 1550   PT Time Calculation (min) 40 min   Activity Tolerance Patient limited by fatigue;Other (comment)  limited by anxiety, confusion      Past Medical History  Diagnosis Date  . Irritable bowel syndrome with diarrhea     no current med.  . Pityriasis lichenoides     arms and legs  . Anxiety   . Lymphadenopathy, inguinal 06/2014    right  . Hematuria, microscopic 05/2014  . Joint pain 06/13/2014    knees and legs  . Cat-scratch disease   . High serum Bartonella henselae antibody titer   . Cat-scratch disease   . Persistent testicular pain   . Skin ulcer     Past Surgical History  Procedure Laterality Date  . Ankle arthroscopy with drilling/microfracture Right 12/15/2013    Procedure: RIGHT ANKLE ARTHROSCOPY WITH MICRO FRACTURE ;  Surgeon: Cody Simmer, MD;  Location: [place];  Service: Orthopedics;  Laterality: Right;  . Lymph node biopsy Right 06/15/2014    Procedure: RIGHT FEMORAL LYMPH NODE BIOPSY;  Surgeon: Cody Mesa, MD;  Location: [place];  Service: General;  Laterality: Right;    Filed Vitals:   03/22/15 1753  BP: 100/70  Pulse: 114  SpO2: 99%    Visit Diagnosis:  Difficulty walking  Right ankle pain  Left knee pain  Decreased ROM of left knee      Subjective Assessment - 03/22/15 1512    Subjective I'm lightheaded today.  Knee pain 5/10-6/10.  Ankle still popping 6/10-7/10.  Pt reports not feeling well, vital signs taken prior  to start of PT.  Attempted tot ake an orthostatic reading and pt realized he couldnt find his keys.  Time spent (15-20 min) searching parking lot, pt anxious.  Outside, pt. could not find his car., began to panic, was confused.  Oriented x 4.  called Brother who had come and taken car while pt. in therapy.     Currently in Pain? Yes  See above.             Cody Todd PT Assessment - 03/22/15 1755    Strength   Left Knee Flexion 5/5   Left Knee Extension 4+/5   Palpation   Patella mobility patellar grinding with LAQ          [facility] Adult PT Treatment/Exercise - 03/22/15 1528    Knee/Hip Exercises: Supine   Quad Sets Strengthening;Left;1 set   Straight Leg Raises Strengthening;Left;1 set;10 reps   Straight Leg Raises Limitations difficulty maintaining knee extension- weakness   Manual Therapy   Manual Therapy Taping;Other (comment)   Other Manual Therapy Mcconnell tape to L knee pulled patella medially   Mulligan fibular a/p   Kinesiotex Ligament Correction  mobilization with movement, I band kinesiotexmid foot   Ankle Exercises: Standing   Rocker Board 5 minutes;Other (comment)  DF/PF and lateral stability exercises    Other Standing Ankle Exercises static lunge on BOSU 15 sec  x 5 each LE , UE as needed for balance             PT Education - 03/22/15 1804    Education provided Yes   Education Details tape, encouraged patient to continue with exercises, general activity   Person(s) Educated Patient   Methods Explanation   Comprehension Verbalized understanding          PT Short Term Goals - 03/12/15 1740    PT SHORT TERM GOAL #1   Title "Independent with initial HEP   Status Achieved   PT SHORT TERM GOAL #2   Title "Report pain decrease at rest from  5/10 to  2 /10.in ankle and knee   Time 4   Period Weeks   Status On-going   PT SHORT TERM GOAL #3   Title "Demonstrate and verbalize understanding of condition management including RICE, positioning, use of A.D.,  HEP.    Baseline trial cane today.  Understands RICE    Time 4   Period Weeks   Status Achieved   PT SHORT TERM GOAL #4   Title Pt will have symmetrical knee AROM of R and left knee with minimal pain 3/10 or less   Baseline ROM = pain LT 3/10   Time 4   Period Weeks   Status Achieved           PT Long Term Goals - 03/05/15 1439    PT LONG TERM GOAL #1   Title "Pt will be independent with advanced HEP.    Status On-going   PT LONG TERM GOAL #2   Title "Pain will decrease to 2/10 or less with all functional activities    Status On-going   PT LONG TERM GOAL #3   Title Pt will be able to tolerate walking and standing for at least 2 hours with minimal pain in order to participate in job/recreational activities   Status On-going   PT LONG TERM GOAL #4   Title "FOTO will improve from 66%   to   35% limitation  indicating improved functional mobility .    Status On-going   PT LONG TERM GOAL #5   Title Pt will be able increase strength in bil LE's in order to participate in plyometric drills in order to return to recreational pursuits    Status On-going               Plan - 03/22/15 [name] was having symptoms today unrelated to his knee/ankle.  He was distracted and confused.  Towards the end of the session he felt feversish, offered water and seemed to feel better once his brother arrived to pick him up.  He expressed feeling hopeless and wondered if  he would be able to live a normal life. I feel this was all related to anxiety/OCD.  I will follow up with him tomorrow and continue to support him as needed.     PT Next Visit Plan standing dynamic exercises, closed chain ankle and knee strengthening, stair stepper, game ready per patient request, assess taping done today.    PT Home Exercise Plan no additional added   Consulted and Agree with Plan of Care Patient;Family member/caregiver   Family Member Consulted brother informed of symptoms         Problem List Patient Active Problem List   Diagnosis Date Noted  . Left knee pain 01/18/2015  . Dysphagia, pharyngoesophageal phase 10/11/2014  . Owens Shark recluse  spider bite 08/16/2014  . 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 Persistent testicular pain   . Skin ulcer   . Cat-scratch disease   . High serum Bartonella henselae antibody titer   . Lymphadenitis 07/05/2014  . Inguinal lymphadenopathy 07/05/2014  . Granuloma due to infection 07/05/2014  . Right ankle pain 11/07/2013  . Bilateral foot pain 01/03/2013  . Lactose intolerance 06/09/2012  . Anxiety 05/27/2012  . Multiple somatic complaints 04/08/2012  . Irritable bowel syndrome with diarrhea 03/29/2012  . Other disorders of synovium, tendon, and bursa(727.89) 04/25/2011  . Pityriasis lichenoides 05/12/1313  . RUQ PAIN 10/09/2009    PAA,JENNIFER 03/22/2015, 6:17 PM  [facility] [address] [place], [place], [address] Phone: [phone]   Fax:  [phone]    Raeford Razor, PT 03/22/2015 6:18 PM Phone: [phone] Fax: [phone]

## 2015-04-03 ENCOUNTER — Ambulatory Visit: Payer: 59 | Admitting: Physical Therapy

## 2015-04-04 ENCOUNTER — Ambulatory Visit: Payer: 59 | Admitting: Physical Therapy

## 2015-04-06 ENCOUNTER — Ambulatory Visit: Payer: 59 | Admitting: Family Medicine

## 2015-04-10 ENCOUNTER — Ambulatory Visit: Payer: 59 | Admitting: Physical Therapy

## 2015-04-10 DIAGNOSIS — M25571 Pain in right ankle and joints of right foot: Secondary | ICD-10-CM

## 2015-04-10 DIAGNOSIS — M25562 Pain in left knee: Secondary | ICD-10-CM

## 2015-04-10 DIAGNOSIS — M25662 Stiffness of left knee, not elsewhere classified: Secondary | ICD-10-CM

## 2015-04-10 DIAGNOSIS — R262 Difficulty in walking, not elsewhere classified: Secondary | ICD-10-CM | POA: Diagnosis not present

## 2015-04-10 NOTE — Therapy (Addendum)
[place], [place], [address] Phone: [phone]   Fax:  [phone]  Physical Therapy Treatment/Re-certification and DISCHARGE  Patient Details  Name: Cody Todd MRN: [id number] Date of Birth: [birth year] Referring Provider:  Pediatrics, [place]  Encounter Date: 04/10/2015      PT End of Session - 04/10/15 2124    Visit Number 6   Number of Visits 16   Date for PT Re-Evaluation 05/01/15   PT Start Time 2585   PT Stop Time 1515   PT Time Calculation (min) 46 min   Activity Tolerance Patient tolerated treatment well;Other (comment)  Increased time explaining symptoms and expressing frustration re: symptoms an d lack of diagnosis   Behavior During Therapy Anxious      Past Medical History  Diagnosis Date  . Irritable bowel syndrome with diarrhea     no current med.  . Pityriasis lichenoides     arms and legs  . Anxiety   . Lymphadenopathy, inguinal 06/2014    right  . Hematuria, microscopic 05/2014  . Joint pain 06/13/2014    knees and legs  . Cat-scratch disease   . High serum Bartonella henselae antibody titer   . Cat-scratch disease   . Persistent testicular pain   . Skin ulcer     Past Surgical History  Procedure Laterality Date  . Ankle arthroscopy with drilling/microfracture Right 12/15/2013    Procedure: RIGHT ANKLE ARTHROSCOPY WITH MICRO FRACTURE ;  Surgeon: Wylene Simmer, MD;  Location: [place];  Service: Orthopedics;  Laterality: Right;  . Lymph node biopsy Right 06/15/2014    Procedure: RIGHT FEMORAL LYMPH NODE BIOPSY;  Surgeon: Donnie Mesa, MD;  Location: [place];  Service: General;  Laterality: Right;    There were no vitals filed for this visit.  Visit Diagnosis:  Difficulty walking  Right ankle pain  Left knee pain  Decreased ROM of left knee      Subjective Assessment - 04/10/15 1428    Subjective Ive been doing my exercises.   I have an ankle wrap which feels good but I don't want to depend on it. Passed out yesterday and fell on knee. Mom present and endorses bizarre somatic complaints (chronic) including iniital injury to low back, persistent testicular pain and urinary frequency. Knee pain persists aswell.    Patient is accompained by: Family member   How long can you sit comfortably? not a problem now   How long can you stand comfortably? 15 min    How long can you walk comfortably? 15 min    Diagnostic tests x ray negative   Patient Stated Goals Be able to work and exercise without pain in left knee and R ankle   Currently in Pain? Yes   Pain Score --  doesnt feel good   Pain Location Ankle   Pain Orientation Right   Pain Descriptors / Indicators Numbness;Tingling   Pain Type Chronic pain   Pain Onset More than a month ago   Pain Frequency Intermittent   Aggravating Factors  walking, standing, turning ankle outward while descending stairs.    Pain Relieving Factors ice, rest   Multiple Pain Sites Yes   Pain Score 5   Pain Location Knee   Pain Orientation Left   Pain Descriptors / Indicators Aching   Pain Type Chronic pain   Pain Onset More than a month ago   Pain Frequency Intermittent  [place] [facility] Adult PT Treatment/Exercise - 04/10/15 2130    Knee/Hip Exercises: Machines for Strengthening   Other Machine Pilates Reformer: foot work 2 Red 1 Blue to include parallel, hip ER , heel raises, stretching with varied positions.    Acupuncturist Location Rt. ankle   Electrical Stimulation Action IFC   Electrical Stimulation Parameters to tolerance   Electrical Stimulation Goals Pain            PT Education - 04/10/15 2122    Education provided Yes   Education Details MD follow up, continuing PT with more consistent attendance and symptoms   Person(s) Educated Patient;Parent(s)   Methods Explanation;Demonstration   Comprehension Verbalized  understanding          PT Short Term Goals - 04/10/15 1434    PT SHORT TERM GOAL #1   Title "Independent with initial HEP   Status Achieved   PT SHORT TERM GOAL #2   Title "Report pain decrease at rest from  5/10 to  2 /10.in ankle and knee   Status On-going   PT SHORT TERM GOAL #3   Title "Demonstrate and verbalize understanding of condition management including RICE, positioning, use of A.D., HEP.    Status On-going   PT SHORT TERM GOAL #4   Title Pt will have symmetrical knee AROM of R and left knee with minimal pain 3/10 or less   Status Achieved           PT Long Term Goals - 04/10/15 1435    PT LONG TERM GOAL #1   Title "Pt will be independent with advanced HEP.    Status On-going   PT LONG TERM GOAL #2   Title "Pain will decrease to 2/10 or less with all functional activities    Status On-going   PT LONG TERM GOAL #3   Title Pt will be able to tolerate walking and standing for at least 2 hours with minimal pain in order to participate in job/recreational activities   Status On-going   PT LONG TERM GOAL #4   Title "FOTO will improve from 66%   to   35% limitation  indicating improved functional mobility .    Status Unable to assess   PT LONG TERM GOAL #5   Title Pt will be able increase strength in bil LE's in order to participate in plyometric drills in order to return to recreational pursuits    Status On-going               Plan - 04/10/15 2126    Clinical Impression Statement Patient missed last visit due to AM nausea, wakes with dread about the day's symptoms.  He is discouraged and mother did not know he didn't follow up with Dr. Micheline Chapman. Pt worried about other sx he is having and I recommmended he F/U with MD WE:XHBZ, ankle and ask about other sx.  His attendance has not been good but he would like to continue PT for 2-3 more weeks with regular attendance.     Pt will benefit from skilled therapeutic intervention in order to improve on the following  deficits Abnormal gait;Decreased activity tolerance;Decreased mobility;Decreased range of motion;Pain;Decreased strength;Increased fascial restricitons;Decreased balance;Difficulty walking   Rehab Potential Good   PT Frequency 2x / week   PT Duration 3 weeks   PT Treatment/Interventions ADLs/Self Care Home Management;Cryotherapy;Moist Heat;Electrical Stimulation;Iontophoresis 68m/ml Dexamethasone;Gait training;Neuromuscular re-education;Therapeutic exercise;Functional mobility training;Manual techniques;Taping;Dry needling;Balance training;Ultrasound   PT Next Visit Plan standing dynamic  exercises, closed chain ankle and knee strengthening, stair stepper, game ready per patient request, assess taping done today.    PT Home Exercise Plan no additional added   Consulted and Agree with Plan of Care Patient;Family member/caregiver   Family Member Consulted mom        Problem List Patient Active Problem List   Diagnosis Date Noted  . Left knee pain 01/18/2015  . Dysphagia, pharyngoesophageal phase 10/11/2014  . Brown recluse spider bite 08/16/2014  . 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 Persistent testicular pain   . Skin ulcer   . Cat-scratch disease   . High serum Bartonella henselae antibody titer   . Lymphadenitis 07/05/2014  . Inguinal lymphadenopathy 07/05/2014  . Granuloma due to infection 07/05/2014  . Right ankle pain 11/07/2013  . Bilateral foot pain 01/03/2013  . Lactose intolerance 06/09/2012  . Anxiety 05/27/2012  . Multiple somatic complaints 04/08/2012  . Irritable bowel syndrome with diarrhea 03/29/2012  . Other disorders of synovium, tendon, and bursa(727.89) 04/25/2011  . Pityriasis lichenoides 47/01/6150  . RUQ PAIN 10/09/2009    PAA,JENNIFER 04/10/2015, 9:38 PM  [facility] [address] [place], [place], [address] Phone: [phone]   Fax:  [phone]    Raeford Razor, PT 04/10/2015 9:38 PM Phone:  [phone] Fax: [phone]   PHYSICAL THERAPY DISCHARGE SUMMARY  Visits from Start of Care: 6 Current functional level related to goals / functional outcomes: See above for goals met.  Continued pain and difficulty walking. Anxiety, depression.     Remaining deficits: Knee pain, ankle pain, LE weakness Patient also reports ongoing testicular pain and urinary frequency.  Did not follow up with Dr. Micheline Chapman when he should have.     Education / Equipment: HEP Plan: Patient agrees to discharge.  Patient goals were partially met. Patient is being discharged due to lack of progress.  ?????   Poor attendance.  No show today and cancelled last visit earlier in the week.  Called to inform Mother of DC, asked him to return to Dr. Micheline Chapman.    Raeford Razor, PT 04/19/2015 2:17 PM Phone: [phone] Fax: [phone]

## 2015-04-12 ENCOUNTER — Ambulatory Visit: Payer: 59 | Admitting: Physical Therapy

## 2015-04-17 ENCOUNTER — Ambulatory Visit: Payer: 59 | Admitting: Physical Therapy

## 2015-04-19 ENCOUNTER — Ambulatory Visit: Payer: 59 | Attending: Family Medicine | Admitting: Physical Therapy

## 2015-05-10 ENCOUNTER — Ambulatory Visit (INDEPENDENT_AMBULATORY_CARE_PROVIDER_SITE_OTHER): Payer: 59 | Admitting: Family Medicine

## 2015-05-10 ENCOUNTER — Encounter: Payer: Self-pay | Admitting: Family Medicine

## 2015-05-10 VITALS — BP 139/74 | HR 68 | Temp 98.3°F | Ht 72.0 in | Wt 174.3 lb

## 2015-05-10 DIAGNOSIS — M5442 Lumbago with sciatica, left side: Secondary | ICD-10-CM

## 2015-05-10 DIAGNOSIS — Z23 Encounter for immunization: Secondary | ICD-10-CM | POA: Diagnosis not present

## 2015-05-10 DIAGNOSIS — N50819 Testicular pain, unspecified: Secondary | ICD-10-CM

## 2015-05-10 DIAGNOSIS — F419 Anxiety disorder, unspecified: Secondary | ICD-10-CM

## 2015-05-10 DIAGNOSIS — M549 Dorsalgia, unspecified: Secondary | ICD-10-CM | POA: Insufficient documentation

## 2015-05-10 DIAGNOSIS — N509 Disorder of male genital organs, unspecified: Secondary | ICD-10-CM

## 2015-05-10 DIAGNOSIS — M5441 Lumbago with sciatica, right side: Secondary | ICD-10-CM

## 2015-05-10 NOTE — Progress Notes (Addendum)
Subjective:    Patient ID: Cody Todd, male    DOB: 04-04-1996, 19 y.o.   MRN: [id number]  HPI 19 y/o male presents to establish care at [place]. Previous PCP Dr. Zenaida Niece (Pediatrics).  Reviewed New Patient Form with patient (see scanned copy) Reviewed PMH/PSH/Medications/Allergies/Social History and updated in EPIC.  Inguinal/scrotal pain - present since September 2015, no inciting event other than fall in June 2014 (see below), can be bilateral testes (changes location), feels like "tugging" sensation, radiates to lower abdomen, seen by both Urology and GI, has had Scrotal US and CT abdomen/pelvis which were unremarkable, had "nerve injection" done by urology which did not improve symptoms, Gabapentin and Norco have helped pain, patient has not taken either of these medications for a number of months  Back pain - patient had fall at [facility] in June 2014, he jumped a rail and fell 6-8 feet straight onto back, sustained a left scaphoid fracture at that time, see in ED in [place] but no imaging of back (has never had imaging of back), reports intermittent lumbar back pain, now reports occasional numbness in bilateral feet over the past weeks to months, no bladder or bowel incontinence, no fevers/chills, no LE weakness  Anxiety - father and patient report anxiety/distress related to multiple medical issues, seen by Dr. Robina Ade (psychiatry), previously on Lexapro (stopped taking as he denies depression, just wants to feel better), does take xanax occasionally  Social - one male sexual partner, only sexual partner, uses condoms, denies urinary burning/discharge, had STD workup when first started to have scrotal pain and was negative (HIV/RPR/GC/Chlam negative 03/2014), does not currently work due to scrotal pain, completed high school, would like to work for father who is in the [employer].    Review of Systems  Constitutional: Negative for fever, chills and fatigue.  Respiratory:  Negative for cough and shortness of breath.   Cardiovascular: Negative for chest pain.  Gastrointestinal: Positive for abdominal pain. Negative for nausea, vomiting, diarrhea, constipation, blood in stool and abdominal distention.  Genitourinary: Positive for testicular pain. Negative for dysuria, flank pain, discharge, scrotal swelling, difficulty urinating and genital sores.       Objective:   Physical Exam Vitals: reviewed Gen: pleasant male, NAD, accompanied by father HEENT: normocephalic, PERRL, EOMI, no scleral icterus, nasal septum midline, no rhinorrhea, MMM, uvula midline, no anterior or posterior cervical lymphadenopathy, no thyromegaly Cardiac: RRR, S1 and S2 present, no murmur, no heaves/thrills Resp: CTAB, normal effort Abd: soft, no tenderness, normal bowel sounds GU: normal male penis, no scrotal mass, right testicular tenderness, no inguinal hernia Ext: no edema, 2+ radial and DP pulses Skin: right inguinal scar, no rash MSK: bilateral paraspinal lumbar tenderness, no midline lumbar tenerness, SLT negative Neuro: strength bilateral hip flexion/leg extension/leg flexion/dorsiflexion/plantarflexion 5/5, decreased sensation to light touch on left LE, 3+ patellar and achilles reflexes bilaterally  Reviewed Scrotal US and Abdominal CT/Pelvis Reviewed labs from previous 18 months     Assessment & Plan:  Back pain Persistent lumbar back pain with neurologic symptoms after fall in June 2014. No previous imaging.  -MRI lumbar spine w/o contrast  Persistent testicular pain Persistent testicular pain since 03/2014. STD workup negative. Completed course of antibiotics for Epididyimtiis. Negative Scrotal US and CT abdomen and pelvis. Previously seen by Urology and GI with no clear diagnosis. -obtain records from Urology (records release sent) -MRI lumbar spine obtained (see section on lumbar back pain) to rule out lumbar spine pathology   Anxiety  Anxiety related to multiple  medical issues at a young age.  -obtain records from Dr. Robina Ade -consider treatment after reviewing records

## 2015-05-10 NOTE — Assessment & Plan Note (Signed)
Persistent testicular pain since 03/2014. STD workup negative. Completed course of antibiotics for Epididyimtiis. Negative Scrotal US and CT abdomen and pelvis. Previously seen by Urology and GI with no clear diagnosis. -obtain records from Urology (records release sent) -MRI lumbar spine obtained (see section on lumbar back pain) to rule out lumbar spine pathology

## 2015-05-10 NOTE — Assessment & Plan Note (Signed)
Anxiety related to multiple medical issues at a young age.  -obtain records from Dr. Robina Ade -consider treatment after reviewing records

## 2015-05-10 NOTE — Addendum Note (Signed)
Addended by: Lupita Dawn on: 05/10/2015 06:43 PM   Modules accepted: Level of Service

## 2015-05-10 NOTE — Assessment & Plan Note (Signed)
Persistent lumbar back pain with neurologic symptoms after fall in June 2014. No previous imaging.  -MRI lumbar spine w/o contrast

## 2015-05-10 NOTE — Patient Instructions (Signed)
It was nice to meet you today.  I would like to check an MRI of your lumbar spine. My nursing staff will schedule this for you.

## 2015-05-11 ENCOUNTER — Ambulatory Visit (HOSPITAL_COMMUNITY)
Admission: RE | Admit: 2015-05-11 | Discharge: 2015-05-11 | Disposition: A | Discharge status: Home / Self Care | Payer: 59 | Source: Ambulatory Visit | Attending: Family Medicine | Admitting: Family Medicine

## 2015-05-11 DIAGNOSIS — M545 Low back pain: Secondary | ICD-10-CM | POA: Diagnosis not present

## 2015-05-11 DIAGNOSIS — N5089 Other specified disorders of the male genital organs: Secondary | ICD-10-CM | POA: Insufficient documentation

## 2015-05-11 DIAGNOSIS — M5441 Lumbago with sciatica, right side: Secondary | ICD-10-CM

## 2015-05-11 DIAGNOSIS — M5442 Lumbago with sciatica, left side: Secondary | ICD-10-CM

## 2015-05-14 ENCOUNTER — Telehealth: Payer: Self-pay | Admitting: Family Medicine

## 2015-05-14 ENCOUNTER — Encounter: Payer: Self-pay | Admitting: Family Medicine

## 2015-05-14 DIAGNOSIS — M5442 Lumbago with sciatica, left side: Principal | ICD-10-CM

## 2015-05-14 DIAGNOSIS — M5441 Lumbago with sciatica, right side: Secondary | ICD-10-CM

## 2015-05-14 DIAGNOSIS — F419 Anxiety disorder, unspecified: Secondary | ICD-10-CM

## 2015-05-14 DIAGNOSIS — N509 Disorder of male genital organs, unspecified: Secondary | ICD-10-CM

## 2015-05-14 DIAGNOSIS — N50819 Testicular pain, unspecified: Secondary | ICD-10-CM

## 2015-05-14 MED ORDER — DULOXETINE HCL 30 MG PO CPEP: 30.0000 mg | ORAL_CAPSULE | Freq: Every day | ORAL | Status: DC

## 2015-05-14 MED ORDER — CYCLOBENZAPRINE HCL 5 MG PO TABS: 5.0000 mg | ORAL_TABLET | Freq: Three times a day (TID) | ORAL | Status: DC | PRN

## 2015-05-14 NOTE — Telephone Encounter (Signed)
Pt mother calling to obtain son's MRI results. Pt is over age 21. Cody Todd, ASA

## 2015-05-14 NOTE — Telephone Encounter (Signed)
Spoke to patient about negative MRI lumbar spine.  Still having back pain despite ibuprofen and heat.  Will attempt trial of Flexeril. Will also start Cymbalta to treat testicular pain and anxiety.   Patient to follow up in 3-4 weeks.

## 2015-05-14 NOTE — Progress Notes (Signed)
Record Review  Dr. 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  Empiric therapy includes NSAID's, Neuropathic meds (Lyrica, Gabapentin), and antihistimines   Dossie Arbour MD

## 2015-05-17 ENCOUNTER — Telehealth: Payer: Self-pay | Admitting: Family Medicine

## 2015-05-17 NOTE — Telephone Encounter (Signed)
pts mom calling re: new meds pt has started, says he is taking flexeril and cymbalta. Pt feels like he has no energy and feels "weird", not sure which medication is doing it. Wasn't sure if he should stop?

## 2015-05-18 NOTE — Telephone Encounter (Signed)
Left message on mother's cell phone stating that Flexeril is the most likely culprit of symptoms (can start to only take at night and then restart during day if tolerating). Cymbalta can also have many different side effects but encouraged Ibraham to continue to take this.

## 2015-06-01 ENCOUNTER — Ambulatory Visit (INDEPENDENT_AMBULATORY_CARE_PROVIDER_SITE_OTHER): Payer: 59 | Admitting: Family Medicine

## 2015-06-01 ENCOUNTER — Encounter: Payer: Self-pay | Admitting: Family Medicine

## 2015-06-01 VITALS — BP 120/71 | HR 93 | Temp 98.1°F | Ht 72.0 in | Wt 169.5 lb

## 2015-06-01 DIAGNOSIS — N50819 Testicular pain, unspecified: Secondary | ICD-10-CM

## 2015-06-01 DIAGNOSIS — G4700 Insomnia, unspecified: Secondary | ICD-10-CM | POA: Diagnosis not present

## 2015-06-01 DIAGNOSIS — N509 Disorder of male genital organs, unspecified: Secondary | ICD-10-CM | POA: Diagnosis not present

## 2015-06-01 DIAGNOSIS — F419 Anxiety disorder, unspecified: Secondary | ICD-10-CM

## 2015-06-01 DIAGNOSIS — M5441 Lumbago with sciatica, right side: Secondary | ICD-10-CM | POA: Diagnosis not present

## 2015-06-01 DIAGNOSIS — M5442 Lumbago with sciatica, left side: Secondary | ICD-10-CM

## 2015-06-01 MED ORDER — PREGABALIN 50 MG PO CAPS: 50.0000 mg | ORAL_CAPSULE | Freq: Three times a day (TID) | ORAL | Status: DC

## 2015-06-01 MED ORDER — MELATONIN 3 MG PO TABS: 1.0000 | ORAL_TABLET | Freq: Every day | ORAL | Status: DC

## 2015-06-01 NOTE — Patient Instructions (Signed)
It was nice to see you today.  Insomnia - start to take melatonin nightly  Testicle pain - start Lyrica twice daily

## 2015-06-01 NOTE — Assessment & Plan Note (Signed)
Pain and radicular symptoms improved with prn Flexeril. -continue prn Flexeril (wean as tolerated) -start Lyrica daily as well

## 2015-06-01 NOTE — Assessment & Plan Note (Signed)
Insomnia likely related to anxiety. -discussed sleep hygiene -attempt trial of Melatonin

## 2015-06-01 NOTE — Progress Notes (Signed)
   Subjective:    Patient ID: Cody Todd, male    DOB: August 20, 1995, 19 y.o.   MRN: [id number]  HPI 19 y/o male presents for routine follow up.  Back pain - recent MRI negative, improved with prn Flexeril (taking 2-3 times per day), pain now 2-3/10  Testicle pain - no improvement with Cymbalta, felt "weird and fatigued" on this medication, has since discontinued Cymbalta and symptoms resolved, still having daily bilateral testicle pain (switches sides).  Anxiety - no improvement while on Cymbalta, taking xanax (prescribed by Dr. Toy Care) prn  Insomnia - continues to have trouble falling asleep, did not respond to Trazodone previously, will stay awake for close to 48 hours at times (reports that he does not want to, related to pain in back/testicles).   Social - lives with parents   Review of Systems  Constitutional: Negative for fever, chills and fatigue.  Respiratory: Negative for cough and shortness of breath.   Cardiovascular: Negative for chest pain.  Gastrointestinal: Negative for nausea, vomiting and diarrhea.       Objective:   Physical Exam Vitals: reviewed Gen: pleasant male, NAD, accompanied by mother Cardiac: RRR, S1 and S2 present, no murmur, no heaves/thrills Resp: CTAB, normal effort MSK: back- no tenderness to palpation Psych: appropriately dressed, some pressured speech, no responding to internal stimuli       Assessment & Plan:  Persistent testicular pain Symptoms persist. No improvement with Cymbalta. Will discontinue Cymbalta due to side effects. -attempt trial of Lyrica (previously responded well to Gabapentin). -if symptoms persist consider referral to pain managment  Back pain Pain and radicular symptoms improved with prn Flexeril. -continue prn Flexeril (wean as tolerated) -start Lyrica daily as well  Insomnia Insomnia likely related to anxiety. -discussed sleep hygiene -attempt trial of Melatonin  Anxiety No improvement with Cymbalta (will  discontinue due to side effects). -as related to physical pain will attempt treatment of pain with Lyrica -continue prn xanax (prescribed previously by Dr. Toy Care)

## 2015-06-01 NOTE — Assessment & Plan Note (Signed)
Symptoms persist. No improvement with Cymbalta. Will discontinue Cymbalta due to side effects. -attempt trial of Lyrica (previously responded well to Gabapentin). -if symptoms persist consider referral to pain managment

## 2015-06-01 NOTE — Assessment & Plan Note (Addendum)
No improvement with Cymbalta (will discontinue due to side effects). -as related to physical pain will attempt treatment of pain with Lyrica -continue prn xanax (prescribed previously by Dr. Toy Care)

## 2015-07-26 MED FILL — ALPRAZolam 0.5 MG TABS: 0.5 | 30 days supply | Qty: 30 | Fill #0

## 2015-07-26 MED FILL — ALPRAZolam ER 1 MG TB24: 1 | 30 days supply | Qty: 30 | Fill #0

## 2015-08-15 IMAGING — US US SCROTUM
1 series · 14 of 25 positions shown · non-contrast
Comparison: Testicular ultrasound March 26, 2014

CLINICAL DATA: Persistent right-sided testicular pain for many
months worse over the past 2 weeks

EXAM:
SCROTAL ULTRASOUND
DOPPLER ULTRASOUND OF THE TESTICLES
TECHNIQUE: Complete ultrasound examination of the testicles, epididymis, and
other scrotal structures was performed. Color and spectral Doppler
ultrasound were also utilized to evaluate blood flow to the
testicles.

[Series 1: us scrotum · 0.06mm/px · 14 of 62 slices shown]
[im 1/62]
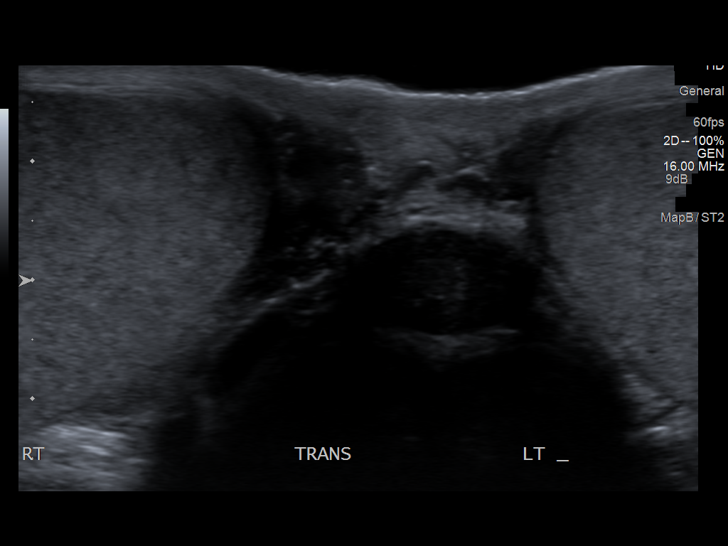
[im 6/62]
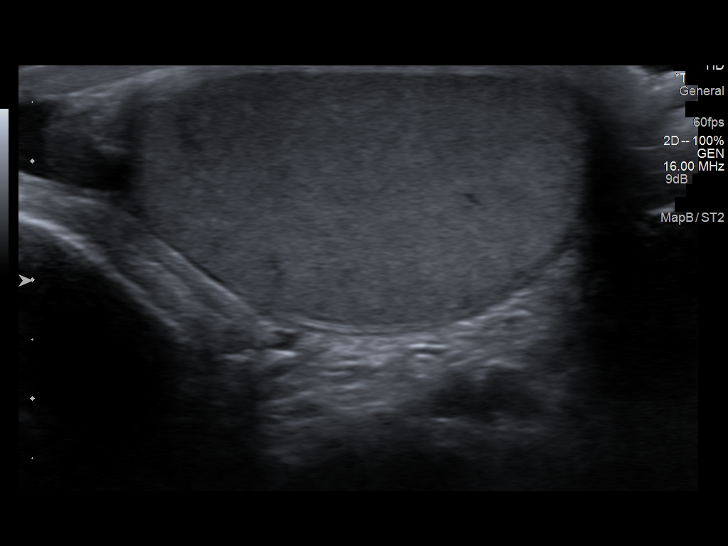
[im 11/62]
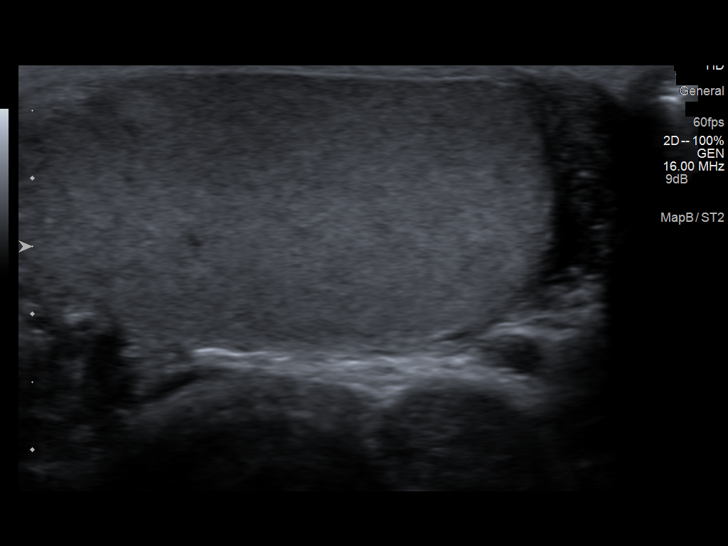
[im 16/62]
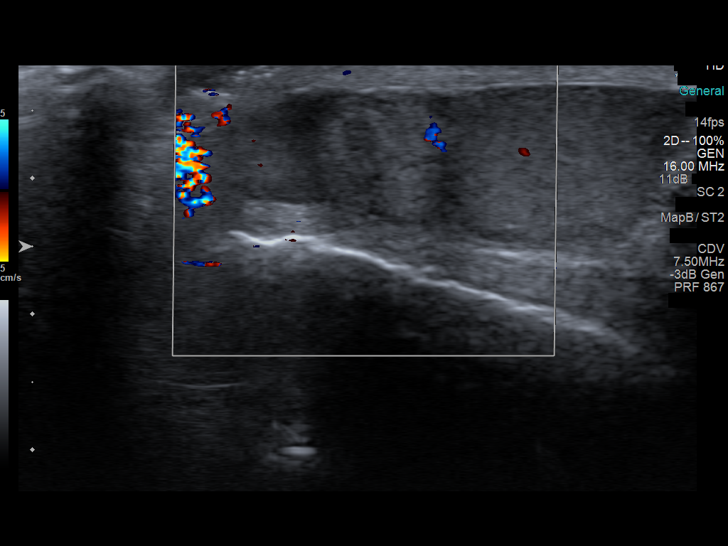
[im 21/62]
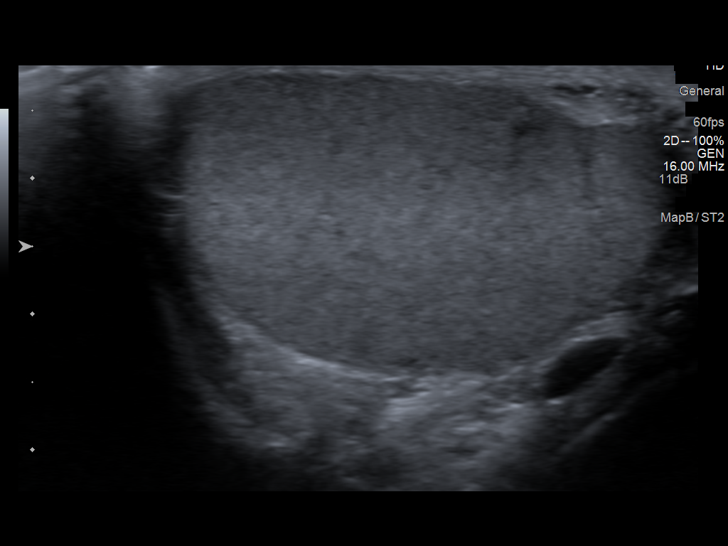
[im 23/62]
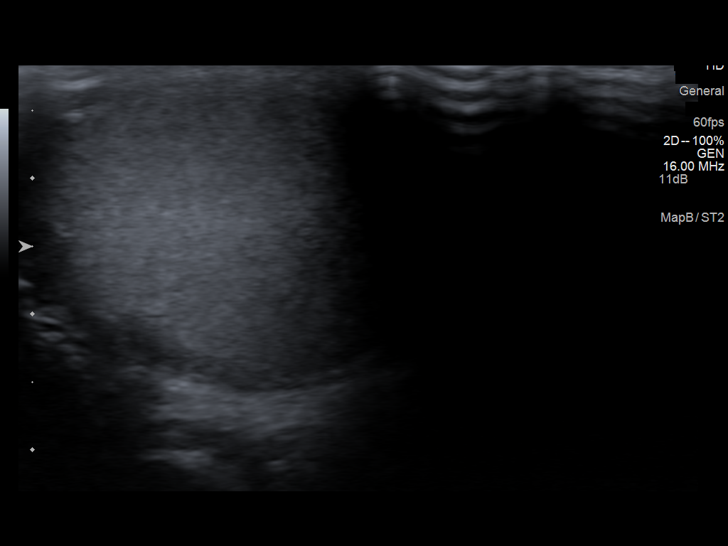
[im 28/62]
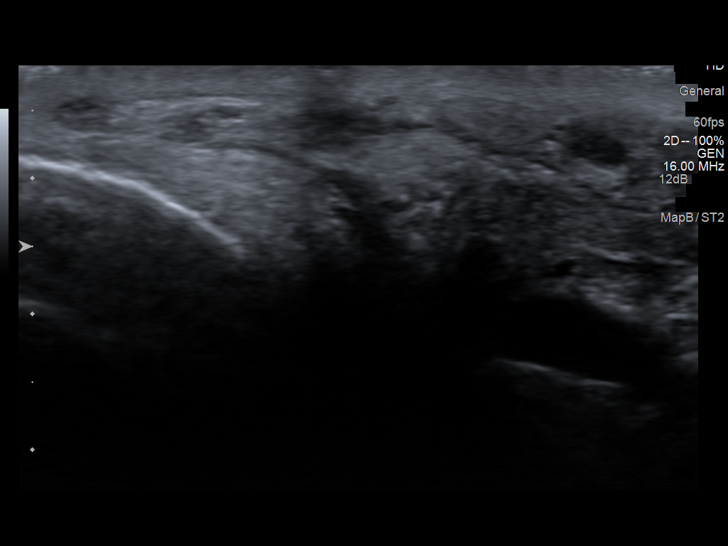
[im 34/62]
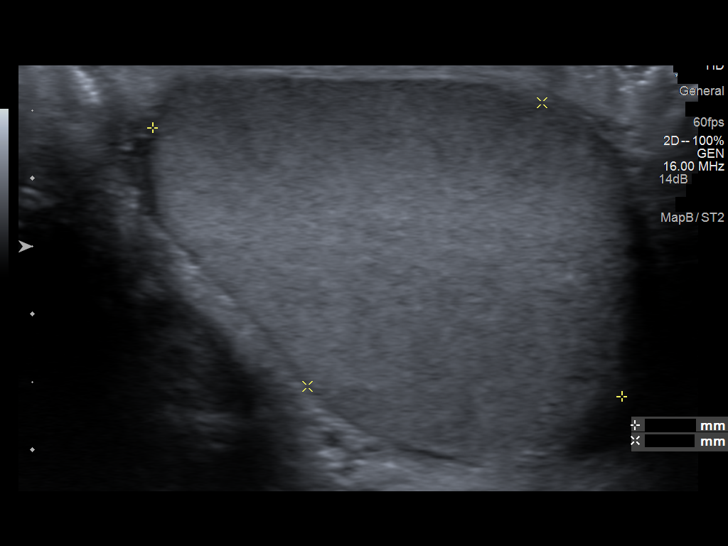
[im 39/62]
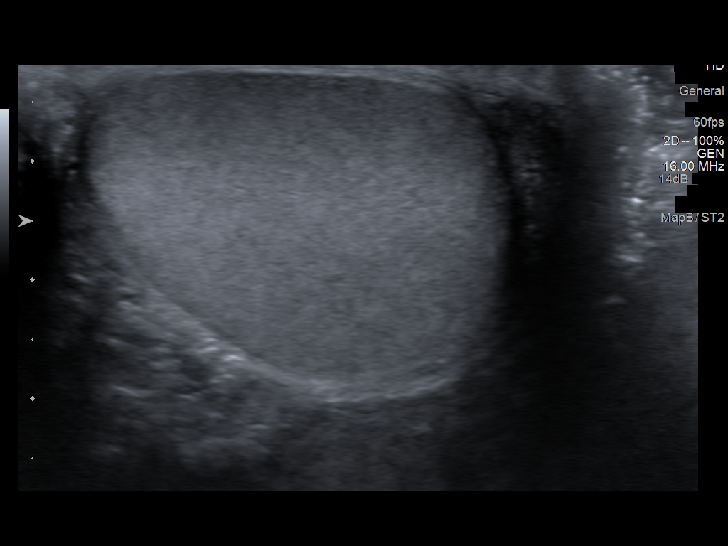
[im 41/62]
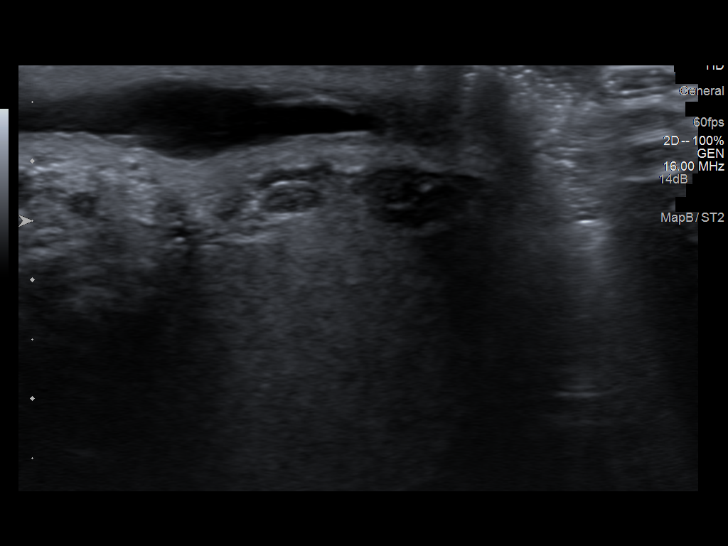
[im 46/62]
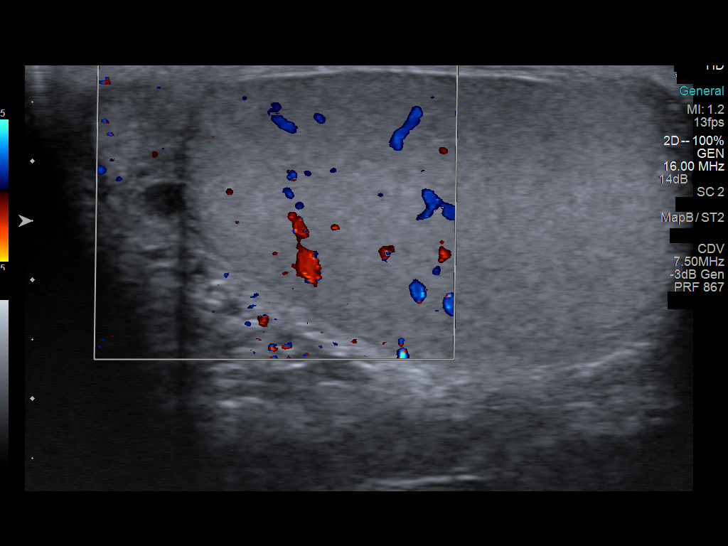
[im 51/62]
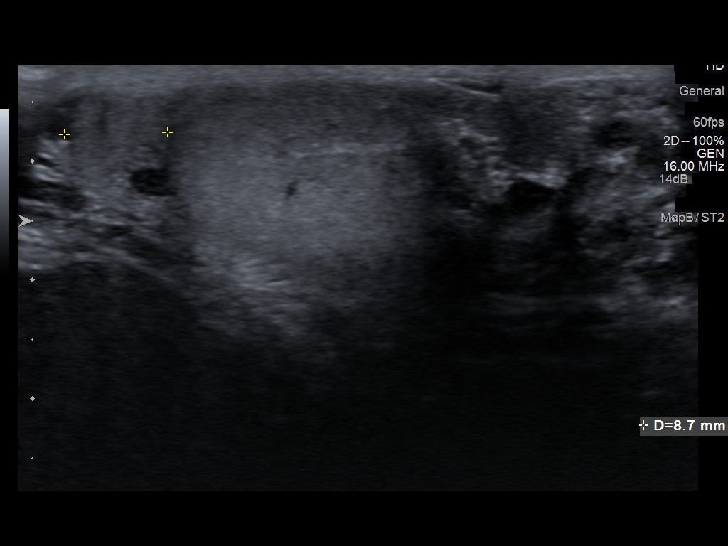
[im 56/62]
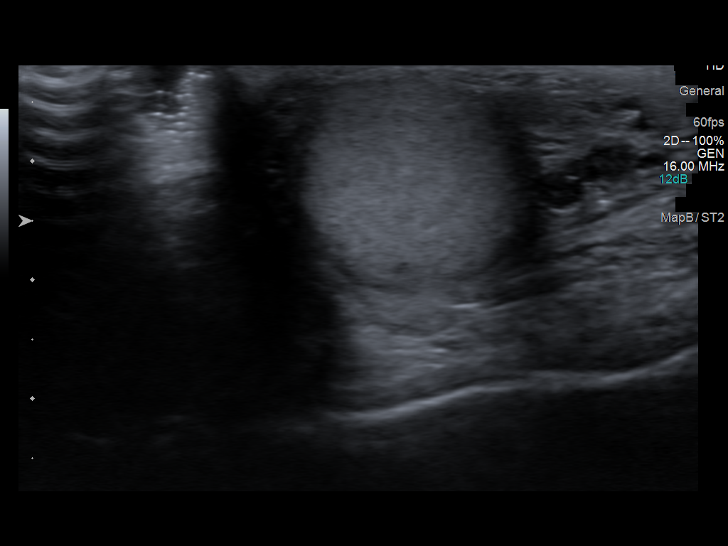
[im 62/62]
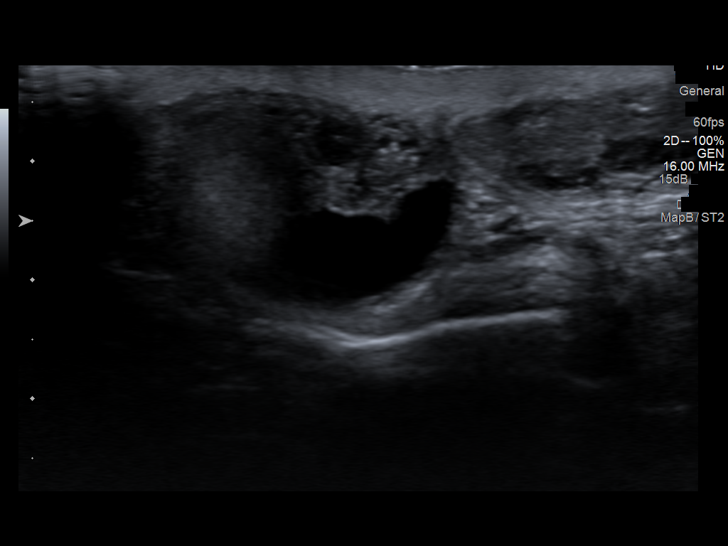

[14 of 25 positions shown; findings below may reference images not displayed]

FINDINGS: Right testicle

Measurements: 4.0 x 2.3 x 3.6 cm. No mass or microlithiasis
visualized.

Left testicle

Measurements: 4.0 x 2.7 x 3.4 cm. No mass or microlithiasis
visualized.

Right epididymis:  Normal in size and appearance.

Left epididymis: Normal in size. There is a 4 mm diameter left
epididymal cyst.

Hydrocele:  Trace bilateral hydroceles.

Varicocele:  None visualized.

Pulsed Doppler interrogation of both testes demonstrates normal low
resistance arterial and venous waveforms bilaterally.
IMPRESSION: Normal testes. There is a 4 mm diameter left epididymal cyst.
Previously described hydroceles have decreased in volume.

## 2015-08-22 DIAGNOSIS — M7652 Patellar tendinitis, left knee: Secondary | ICD-10-CM | POA: Diagnosis not present

## 2015-08-22 DIAGNOSIS — M25562 Pain in left knee: Secondary | ICD-10-CM | POA: Diagnosis not present

## 2015-08-22 DIAGNOSIS — G8929 Other chronic pain: Secondary | ICD-10-CM | POA: Diagnosis not present

## 2015-08-23 DIAGNOSIS — M25562 Pain in left knee: Secondary | ICD-10-CM | POA: Diagnosis not present

## 2015-08-23 MED FILL — ALPRAZolam 0.5 MG TABS: 0.5 | 30 days supply | Qty: 30 | Fill #1

## 2015-08-23 MED FILL — ALPRAZolam ER 1 MG TB24: 1 | 30 days supply | Qty: 30 | Fill #1

## 2015-08-25 ENCOUNTER — Emergency Department (INDEPENDENT_AMBULATORY_CARE_PROVIDER_SITE_OTHER): Payer: 59

## 2015-08-25 ENCOUNTER — Emergency Department (INDEPENDENT_AMBULATORY_CARE_PROVIDER_SITE_OTHER)
Admission: EM | Admit: 2015-08-25 | Discharge: 2015-08-25 | Disposition: A | Discharge status: Home / Self Care | Payer: 59 | Source: Home / Self Care | Attending: Family Medicine | Admitting: Family Medicine

## 2015-08-25 ENCOUNTER — Encounter (HOSPITAL_COMMUNITY): Payer: Self-pay | Admitting: Emergency Medicine

## 2015-08-25 DIAGNOSIS — S63601A Unspecified sprain of right thumb, initial encounter: Secondary | ICD-10-CM | POA: Diagnosis not present

## 2015-08-25 DIAGNOSIS — S6991XA Unspecified injury of right wrist, hand and finger(s), initial encounter: Secondary | ICD-10-CM | POA: Diagnosis not present

## 2015-08-25 NOTE — Discharge Instructions (Signed)
Wear splint for comfort as needed, ice and advil for pain and swelling as needed, see orthopedist if further problems.

## 2015-08-25 NOTE — ED Notes (Signed)
Complains of right thumb injury, pain and redness noted to right thumb

## 2015-08-25 NOTE — ED Provider Notes (Signed)
CSN: [id number]     Arrival date & time 08/25/15  1901 History   First MD Initiated Contact with Patient 08/25/15 1926     Chief Complaint  Patient presents with  . Hand Injury   (Consider location/radiation/quality/duration/timing/severity/associated sxs/prior Treatment) Patient is a 20 y.o. male presenting with hand injury. The history is provided by the patient and a parent.  Hand Injury Location:  Finger Time since incident:  1 hour Injury: yes   Mechanism of injury comment:  Wrestling and thumb bent Finger location:  R thumb Pain details:    Radiates to:  Does not radiate   Severity:  Mild   Onset quality:  Sudden Chronicity:  New Dislocation: no     Past Medical History  Diagnosis Date  . Irritable bowel syndrome with diarrhea     no current med.  . Pityriasis lichenoides     arms and legs  . Anxiety   . Lymphadenopathy, inguinal 06/2014    right  . Hematuria, microscopic 05/2014  . Joint pain 06/13/2014    knees and legs  . Cat-scratch disease   . High serum Bartonella henselae antibody titer   . Cat-scratch disease   . Persistent testicular pain   . Skin ulcer ([place])    Past Surgical History  Procedure Laterality Date  . Ankle arthroscopy with drilling/microfracture Right 12/15/2013    Procedure: RIGHT ANKLE ARTHROSCOPY WITH MICRO FRACTURE ;  Surgeon: Wylene Simmer, MD;  Location: [facility];  Service: Orthopedics;  Laterality: Right;  . Lymph node biopsy Right 06/15/2014    Procedure: RIGHT FEMORAL LYMPH NODE BIOPSY;  Surgeon: Donnie Mesa, MD;  Location: [facility];  Service: General;  Laterality: Right;   Family History  Problem Relation Age of Onset  . Obesity Father   . ADD / ADHD Brother   . Depression Brother   . Mitral valve prolapse Maternal Grandmother   . Cancer Maternal Grandmother     uterine  . Dementia Maternal Grandfather   . Thyroid disease Maternal Grandfather   . Cancer Paternal Grandfather     renal  .  Hypertension Mother    Social History  Substance Use Topics  . Smoking status: Former Research scientist (life sciences)  . Smokeless tobacco: Never Used     Comment: mother thinks he has not smoked in a few months  . Alcohol Use: 0.0 oz/week    0 Standard drinks or equivalent per week     Comment: occasional    Review of Systems  Constitutional: Negative.   Musculoskeletal: Positive for joint swelling.  Skin: Negative.   All other systems reviewed and are negative.   Allergies  Lactose intolerance (gi)  Home Medications   Prior to Admission medications   Medication Sig Start Date End Date Taking? Authorizing Provider  ALPRAZolam (XANAX XR) 1 MG 24 hr tablet Take 1 mg by mouth every morning. 11/22/14   Historical Provider, MD  cyclobenzaprine (FLEXERIL) 5 MG tablet Take 1 tablet (5 mg total) by mouth 3 (three) times daily as needed for muscle spasms. 05/14/15   Lupita Dawn, MD  ibuprofen (ADVIL,MOTRIN) 800 MG tablet Take 1 tablet (800 mg total) by mouth every 8 (eight) hours as needed. 11/11/14   Liam Graham, PA-C  Melatonin 3 MG TABS Take 1 tablet (3 mg total) by mouth at bedtime. 06/01/15   Lupita Dawn, MD  pregabalin (LYRICA) 50 MG capsule Take 1 capsule (50 mg total) by mouth 3 (three) times daily. 06/01/15  Lupita Dawn, MD   Meds Ordered and Administered this Visit  Medications - No data to display  BP 122/78 mmHg  Pulse 102  Temp(Src) 98.1 F (36.7 C) (Oral)  SpO2 100% No data found.   Physical Exam  Constitutional: He is oriented to person, place, and time. He appears well-developed and well-nourished. No distress.  Musculoskeletal: He exhibits tenderness.  Tender mp joint of right thumb, pain with rom.  Neurological: He is alert and oriented to person, place, and time.  Skin: Skin is warm and dry.  Nursing note and vitals reviewed.   ED Course  Procedures (including critical care time)  Labs Review Labs Reviewed - No data to display  Imaging Review Dg Finger Thumb  Right  08/25/2015  CLINICAL DATA:  Injury to right thumb. EXAM: RIGHT THUMB 2+V COMPARISON:  None. FINDINGS: Osseous alignment is normal. Bone mineralization is normal. No fracture line or displaced fracture fragment seen. Adjacent soft tissues are unremarkable. IMPRESSION: Negative. Electronically Signed   By: Franki Cabot M.D.   On: 08/25/2015 19:50   X-rays reviewed and report per radiologist.   Visual Acuity Review  Right Eye Distance:   Left Eye Distance:   Bilateral Distance:    Right Eye Near:   Left Eye Near:    Bilateral Near:         MDM   1. Thumb sprain, right, initial encounter       Billy Fischer, MD 08/25/15 2002

## 2015-08-31 DIAGNOSIS — M25562 Pain in left knee: Secondary | ICD-10-CM | POA: Diagnosis not present

## 2015-09-07 DIAGNOSIS — M25562 Pain in left knee: Secondary | ICD-10-CM | POA: Diagnosis not present

## 2015-09-17 DIAGNOSIS — M25562 Pain in left knee: Secondary | ICD-10-CM | POA: Diagnosis not present

## 2015-09-20 DIAGNOSIS — M25562 Pain in left knee: Secondary | ICD-10-CM | POA: Diagnosis not present

## 2015-09-20 MED FILL — ALPRAZolam 0.5 MG TABS: 0.5 | 30 days supply | Qty: 30 | Fill #2

## 2015-09-20 MED FILL — ALPRAZolam ER 1 MG TB24: 1 | 30 days supply | Qty: 30 | Fill #2

## 2015-10-02 DIAGNOSIS — M25562 Pain in left knee: Secondary | ICD-10-CM | POA: Diagnosis not present

## 2015-10-22 MED FILL — ALPRAZolam ER 1 MG TB24: 1 | 30 days supply | Qty: 30 | Fill #3

## 2015-10-22 MED FILL — ALPRAZolam 0.5 MG TABS: 0.5 | 30 days supply | Qty: 30 | Fill #3

## 2015-11-20 MED FILL — ALPRAZolam ER 1 MG TB24: 1 | 30 days supply | Qty: 30 | Fill #4

## 2015-11-20 MED FILL — ALPRAZolam 0.5 MG TABS: 0.5 | 30 days supply | Qty: 30 | Fill #4

## 2015-11-21 ENCOUNTER — Ambulatory Visit (INDEPENDENT_AMBULATORY_CARE_PROVIDER_SITE_OTHER): Payer: 59 | Admitting: Physician Assistant

## 2015-11-21 ENCOUNTER — Ambulatory Visit (INDEPENDENT_AMBULATORY_CARE_PROVIDER_SITE_OTHER): Payer: 59

## 2015-11-21 VITALS — BP 100/64 | HR 113 | Temp 98.5°F | Resp 16 | Ht 71.0 in | Wt 158.4 lb

## 2015-11-21 DIAGNOSIS — M79602 Pain in left arm: Secondary | ICD-10-CM

## 2015-11-21 DIAGNOSIS — M79642 Pain in left hand: Secondary | ICD-10-CM

## 2015-11-21 DIAGNOSIS — S59912A Unspecified injury of left forearm, initial encounter: Secondary | ICD-10-CM | POA: Diagnosis not present

## 2015-11-21 DIAGNOSIS — W19XXXA Unspecified fall, initial encounter: Secondary | ICD-10-CM | POA: Diagnosis not present

## 2015-11-21 DIAGNOSIS — S6992XA Unspecified injury of left wrist, hand and finger(s), initial encounter: Secondary | ICD-10-CM | POA: Diagnosis not present

## 2015-11-21 NOTE — Progress Notes (Signed)
11/21/2015 2:13 PM   DOB: Aug 26, 1995 / MRN: [id number]  SUBJECTIVE:  Cody Todd is a 20 y.o. male presenting for left hand pain after a fall down some stairs 2 hours ago.  Complains of thenar eminence pain of the left hand and some mid ulnar pain and swelling.  He has previously broken his scaphoid bone on the left side and this was two years ago.  He is generally upset with his medical care in the past and feels that problmes have never been explained to him clearly.    He is allergic to lactose intolerance (gi).   He  has a past medical history of Irritable bowel syndrome with diarrhea; Pityriasis lichenoides; Anxiety; Lymphadenopathy, inguinal (06/2014); Hematuria, microscopic (05/2014); Joint pain (06/13/2014); Cat-scratch disease; High serum Bartonella henselae antibody titer; Cat-scratch disease; Persistent testicular pain; and Skin ulcer ([place]).    He  reports that he has quit smoking. He has never used smokeless tobacco. He reports that he drinks alcohol. He reports that he uses illicit drugs (Marijuana) about once per week. He  reports that he currently engages in sexual activity and has had male partners. He reports using the following method of birth control/protection: Condom. The patient  has past surgical history that includes Ankle arthroscopy with drilling/microfracture (Right, 12/15/2013) and Lymph node biopsy (Right, 06/15/2014).  His family history includes ADD / ADHD in his brother; Cancer in his maternal grandmother and paternal grandfather; Dementia in his maternal grandfather; Depression in his brother; Hypertension in his mother; Mitral valve prolapse in his maternal grandmother; Obesity in his father; Thyroid disease in his maternal grandfather.  Review of Systems  Constitutional: Negative for fever and chills.  Musculoskeletal: Positive for joint pain and falls.  Skin: Negative for itching and rash.  Neurological: Negative for headaches.    Problem list and  medications reviewed and updated by myself where necessary, and exist elsewhere in the encounter.   OBJECTIVE:  BP 100/64 mmHg  Pulse 113  Temp(Src) 98.5 F (36.9 C) (Oral)  Resp 16  Ht 5\' 11"  (1.803 m)  Wt 158 lb 6.4 oz (71.85 kg)  BMI 22.10 kg/m2  SpO2 99%  Physical Exam  Constitutional: He is oriented to person, place, and time.  Cardiovascular: Normal rate and regular rhythm.   Pulmonary/Chest: Effort normal.  Musculoskeletal: Normal range of motion.       Left forearm: He exhibits tenderness, bony tenderness and swelling.       Arms:      Hands: Neurological: He is alert and oriented to person, place, and time.  Skin: Skin is warm and dry.  Psychiatric: He has a normal mood and affect.  Vitals reviewed.   No results found for this or any previous visit (from the past 72 hour(s)).  Dg Forearm Left  11/21/2015  CLINICAL DATA:  LEFT forearm injury today from fall, fall on outstretched hand, history scaphoid fracture EXAM: LEFT FOREARM - 2 VIEW COMPARISON:  CT LEFT wrist 01/17/2013 FINDINGS: Osseous mineralization normal. Joint spaces preserved with normal joint alignments. Lucency at the scaphoid waist without definite cortical disruption, question sequela of previously identified scaphoid fracture though acute nondisplaced scaphoid fracture cannot be excluded with this appearance. Borderline widening of scapholunate interval. No radial or ulnar fracture identified. IMPRESSION: Lucency at the scaphoid waist, potentially sequela of remote scaphoid fracture though cannot entirely exclude an acute nondisplaced fracture. Remainder of exam unremarkable. Electronically Signed   By: Lavonia Dana M.D.   On: 11/21/2015 14:10  Dg Hand Complete Left  11/21/2015  CLINICAL DATA:  LEFT hand injury, tripped and fell down the stairs today, fall on outstretched hand, history scaphoid fracture EXAM: LEFT HAND - COMPLETE 3+ VIEW COMPARISON:  01/17/2013 CT LEFT wrist FINDINGS: Osseous mineralization  normal. Joint spaces preserved. Subtle linear lucency identified at scaphoid waist, without definite cortical disruption; this could represent the sequela of the previously identified LEFT scaphoid waist fracture though a recurrent acute nondisplaced scaphoid fracture is not excluded. No additional potential fracture, dislocation or bone destruction. IMPRESSION: Linear lucency at the scaphoid waist without definite cortical disruption, cannot exclude nondisplaced scaphoid waist fracture versus sequela of scaphoid fracture identified 2014. Electronically Signed   By: Lavonia Dana M.D.   On: 11/21/2015 14:07    ASSESSMENT AND PLAN  Mihailo was seen today for hand injury and arm injury.  Diagnoses and all orders for this visit:  Fall, initial encounter: Rads negative here for overt fracture. He was placed in a splint with thumb support.  Advised that he return in one week for re imaging of the left nscaphoid.  He is a difficult patient and seemed very unhappy with the service that he was provided today.  His father was with him today and paying for his medical care and seemed very reasonable.   Left hand pain -     DG Hand Complete Left; Future  Left arm pain -     DG Forearm Left; Future    The patient was advised to call or return to clinic if he does not see an improvement in symptoms or to seek the care of the closest emergency department if he worsens with the above plan.   Philis Fendt, MHS, PA-C [facility] 11/21/2015 2:13 PM

## 2015-11-21 NOTE — Patient Instructions (Signed)
Dg Forearm Left  11/21/2015  CLINICAL DATA:  LEFT forearm injury today from fall, fall on outstretched hand, history scaphoid fracture EXAM: LEFT FOREARM - 2 VIEW COMPARISON:  CT LEFT wrist 01/17/2013 FINDINGS: Osseous mineralization normal. Joint spaces preserved with normal joint alignments. Lucency at the scaphoid waist without definite cortical disruption, question sequela of previously identified scaphoid fracture though acute nondisplaced scaphoid fracture cannot be excluded with this appearance. Borderline widening of scapholunate interval. No radial or ulnar fracture identified. IMPRESSION: Lucency at the scaphoid waist, potentially sequela of remote scaphoid fracture though cannot entirely exclude an acute nondisplaced fracture. Remainder of exam unremarkable. Electronically Signed   By: Lavonia Dana M.D.   On: 11/21/2015 14:10   Dg Hand Complete Left  11/21/2015  CLINICAL DATA:  LEFT hand injury, tripped and fell down the stairs today, fall on outstretched hand, history scaphoid fracture EXAM: LEFT HAND - COMPLETE 3+ VIEW COMPARISON:  01/17/2013 CT LEFT wrist FINDINGS: Osseous mineralization normal. Joint spaces preserved. Subtle linear lucency identified at scaphoid waist, without definite cortical disruption; this could represent the sequela of the previously identified LEFT scaphoid waist fracture though a recurrent acute nondisplaced scaphoid fracture is not excluded. No additional potential fracture, dislocation or bone destruction. IMPRESSION: Linear lucency at the scaphoid waist without definite cortical disruption, cannot exclude nondisplaced scaphoid waist fracture versus sequela of scaphoid fracture identified 2014. Electronically Signed   By: Lavonia Dana M.D.   On: 11/21/2015 14:07    Wear the s;lint at all times aside from bathing.  Take 400-600 mg of ibuprofen every eight hours as needed for pain.

## 2015-12-25 MED FILL — ALPRAZolam 0.5 MG TABS: 0.5 | 30 days supply | Qty: 30 | Fill #0

## 2015-12-25 MED FILL — ALPRAZolam ER 1 MG TB24: 1 | 30 days supply | Qty: 30 | Fill #0

## 2016-01-09 IMAGING — DX DG KNEE COMPLETE 4+V*L*
4 series · 4 of 4 positions shown · non-contrast
Comparison: None.

CLINICAL DATA: 19-year-old male who fell on his left knee 1 month
ago. Continued pain at the patella. Initial encounter.

EXAM:
LEFT KNEE - COMPLETE 4+ VIEW

[knee ap]
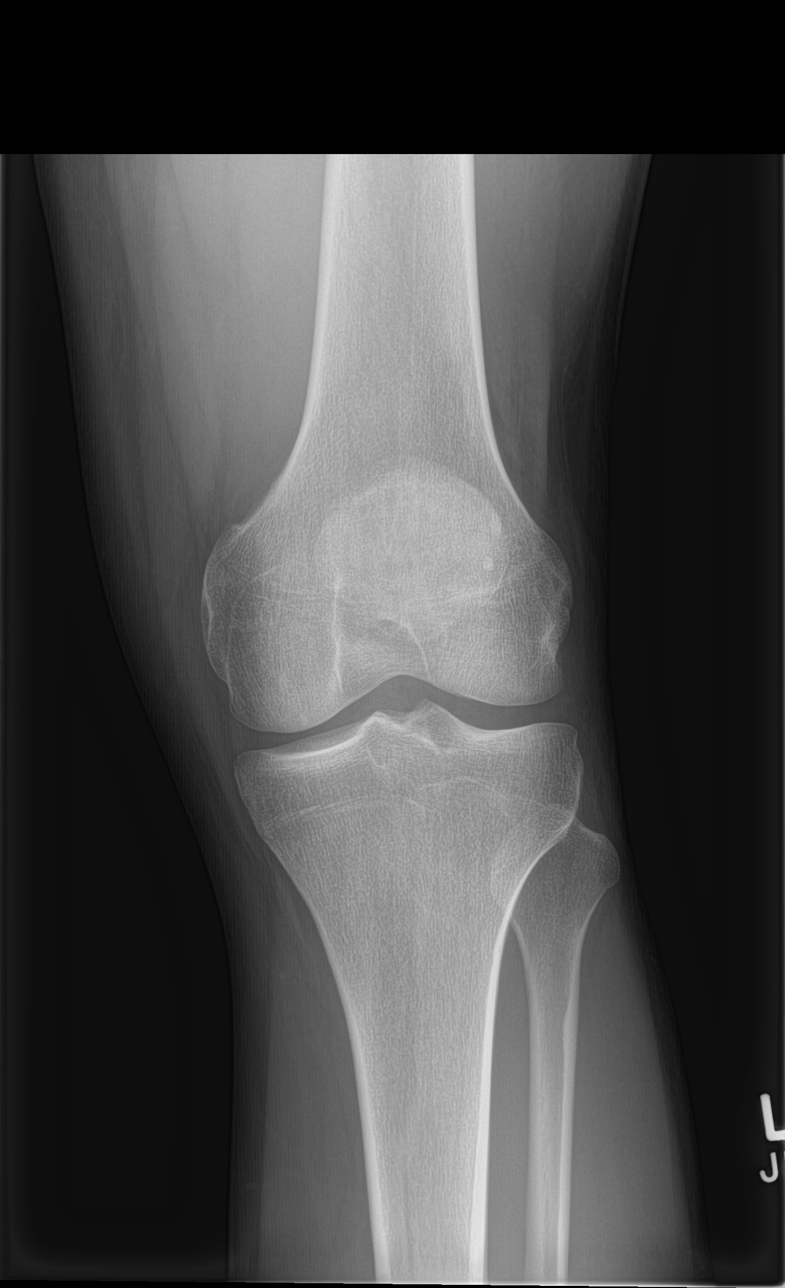

[tunnel]
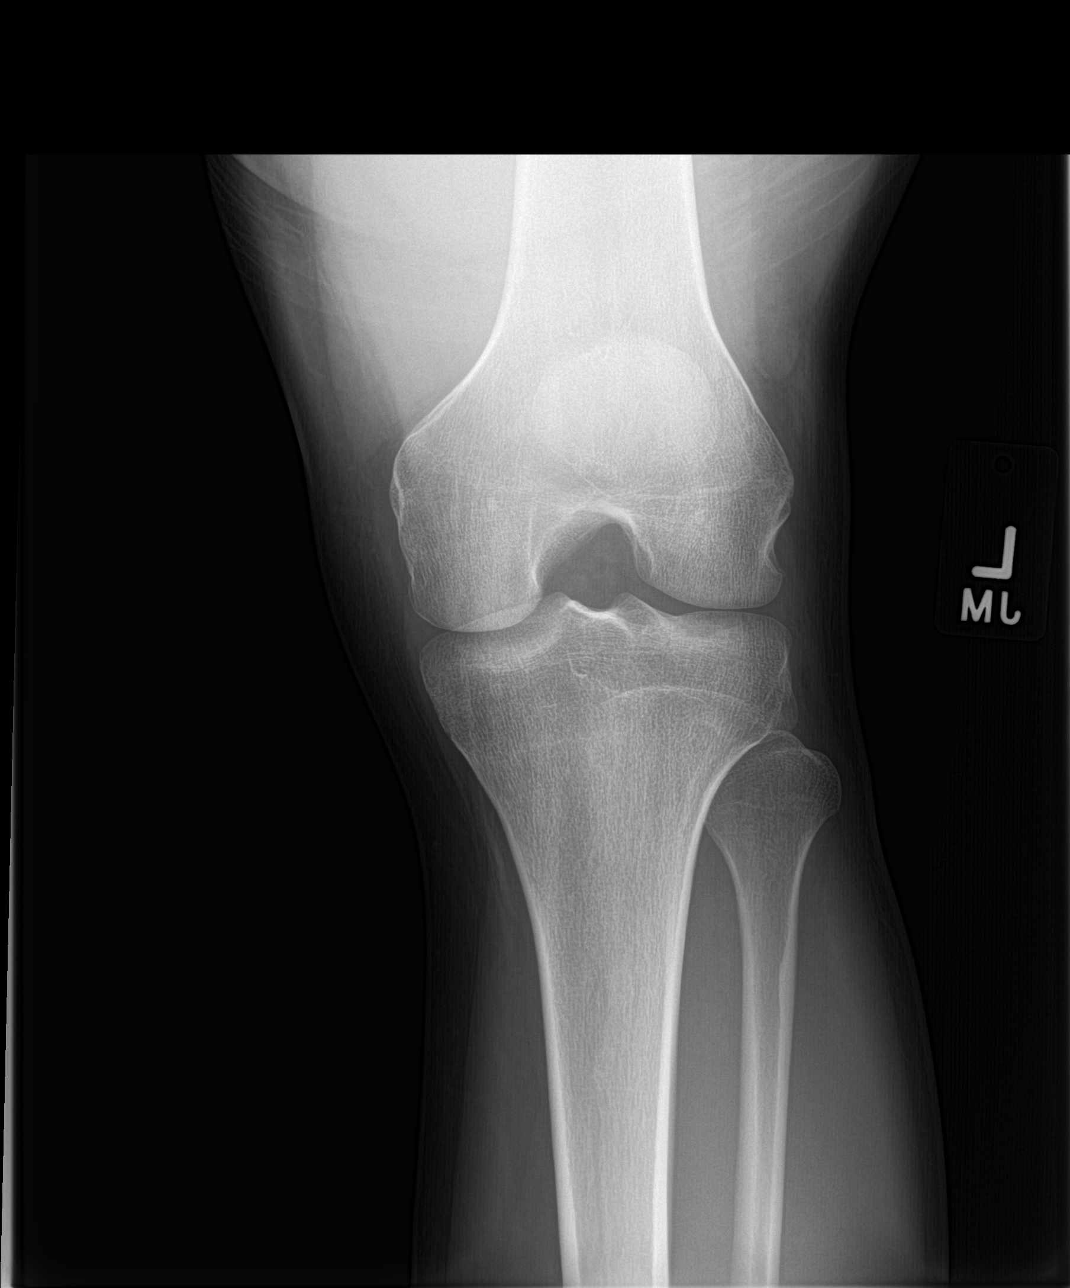

[knee lat]
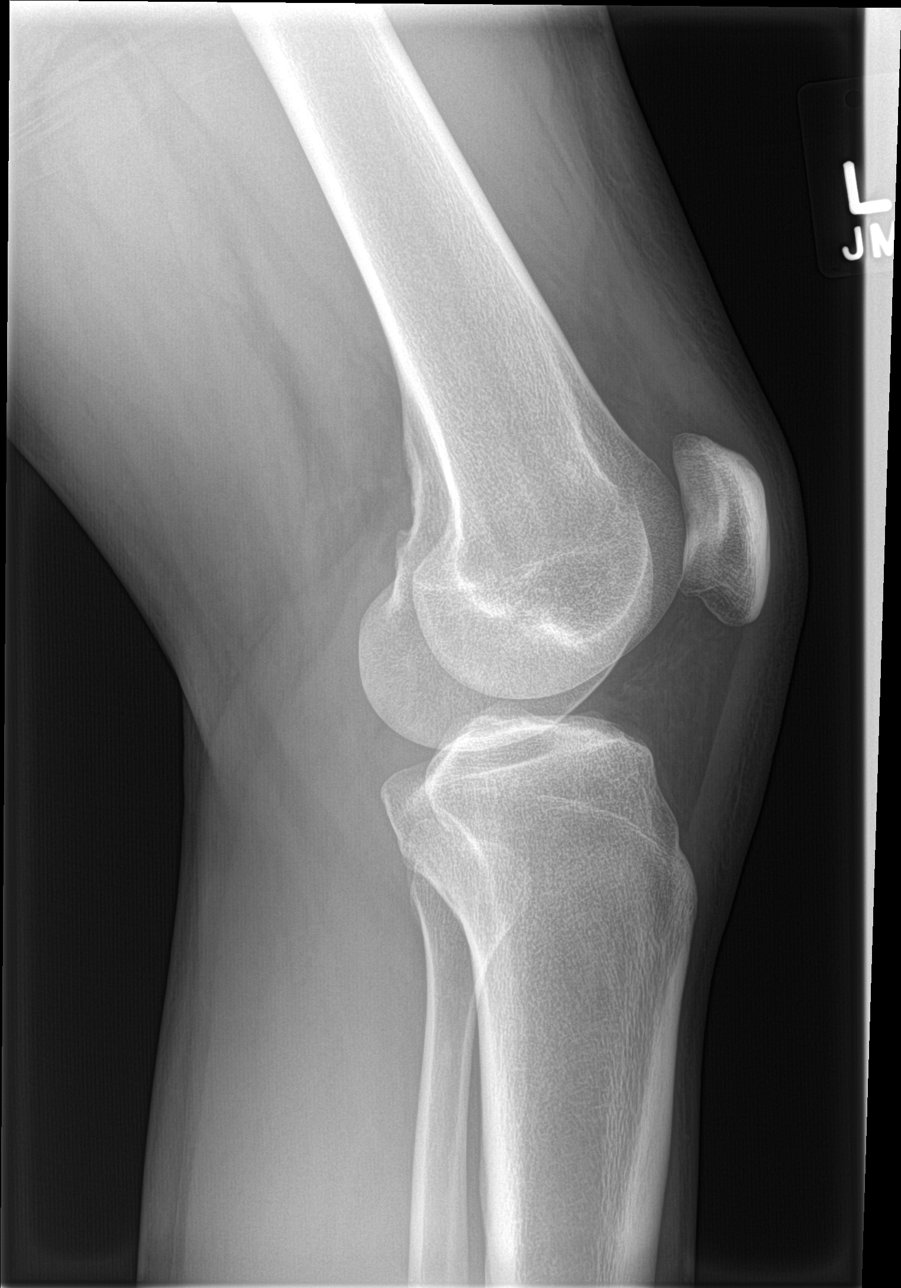

[knee sunrise]
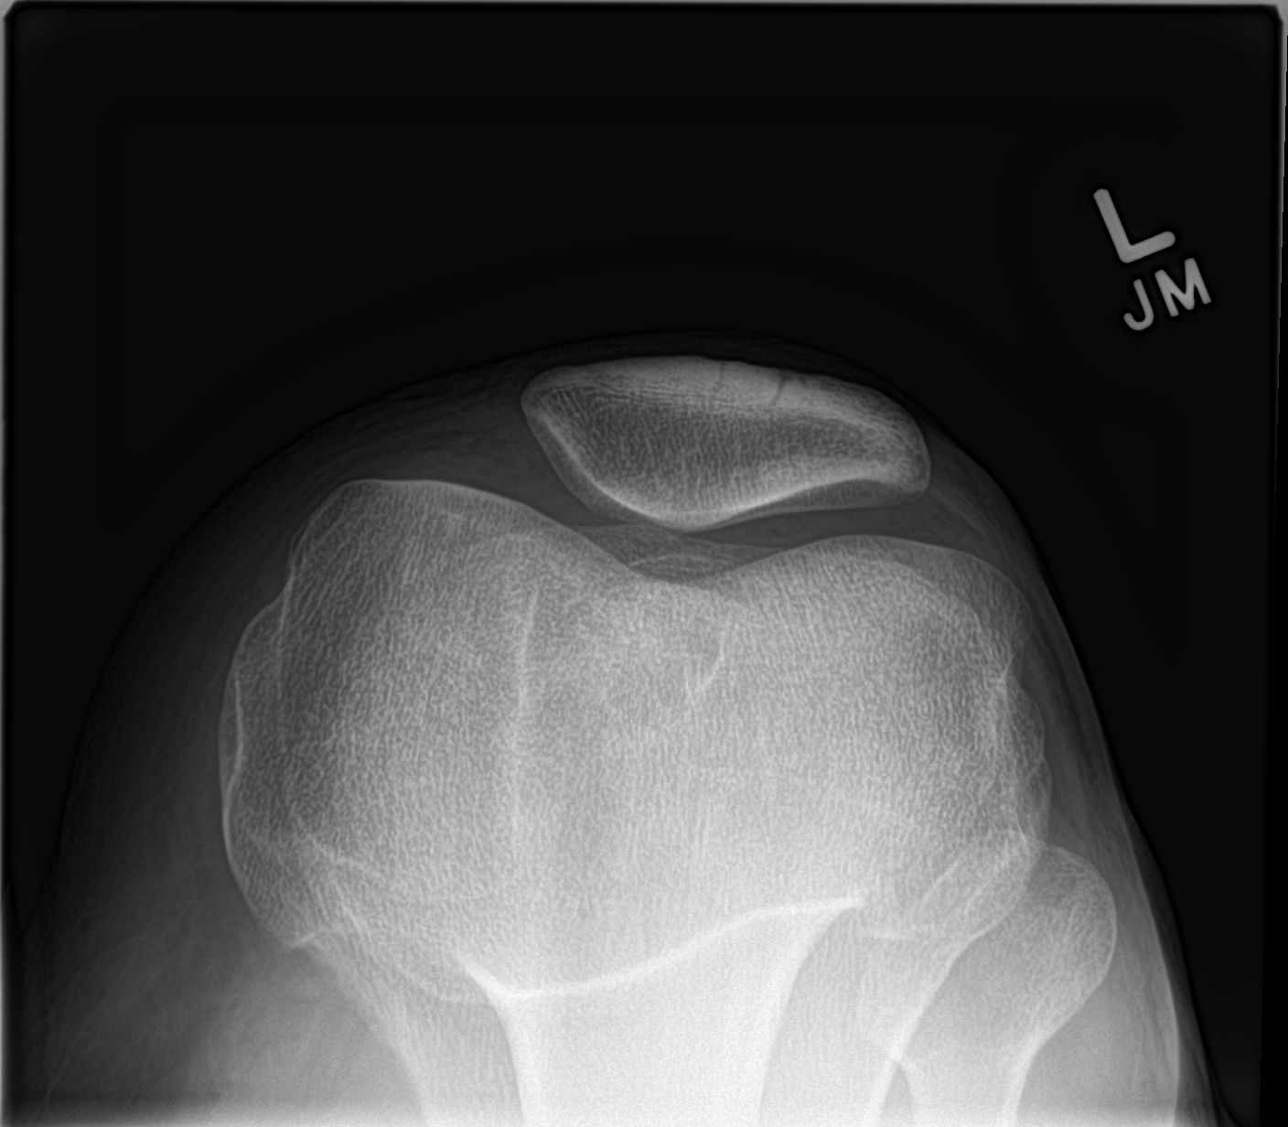

[4 of 4 positions shown; findings below may reference images not displayed]

FINDINGS: Standing views. Patella intact. Joint spaces are preserved. No joint
effusion. Bone mineralization is within normal limits. No fracture
or dislocation identified.
IMPRESSION: Negative radiographic appearance of the left knee.

## 2016-01-23 MED FILL — ALPRAZolam 0.5 MG TABS: 0.5 | 30 days supply | Qty: 30 | Fill #1

## 2016-01-23 MED FILL — ALPRAZolam ER 1 MG TB24: 1 | 30 days supply | Qty: 30 | Fill #1

## 2016-01-24 ENCOUNTER — Other Ambulatory Visit (HOSPITAL_COMMUNITY)
Admission: RE | Admit: 2016-01-24 | Discharge: 2016-01-24 | Disposition: A | Discharge status: Home / Self Care | Payer: 59 | Source: Ambulatory Visit | Attending: Family Medicine | Admitting: Family Medicine

## 2016-01-24 ENCOUNTER — Ambulatory Visit (INDEPENDENT_AMBULATORY_CARE_PROVIDER_SITE_OTHER): Payer: 59 | Admitting: Family Medicine

## 2016-01-24 VITALS — BP 115/82 | HR 127 | Temp 99.0°F | Wt 172.0 lb

## 2016-01-24 DIAGNOSIS — D126 Benign neoplasm of colon, unspecified: Secondary | ICD-10-CM | POA: Insufficient documentation

## 2016-01-24 DIAGNOSIS — Z202 Contact with and (suspected) exposure to infections with a predominantly sexual mode of transmission: Secondary | ICD-10-CM

## 2016-01-24 DIAGNOSIS — N4889 Other specified disorders of penis: Secondary | ICD-10-CM | POA: Diagnosis not present

## 2016-01-24 DIAGNOSIS — N489 Disorder of penis, unspecified: Secondary | ICD-10-CM

## 2016-01-24 LAB — HIV ANTIBODY (ROUTINE TESTING W REFLEX): HIV 1&2 Ab, 4th Generation: NONREACTIVE

## 2016-01-24 NOTE — Patient Instructions (Signed)
Your physical exam today was normal. I have ordered tests, and we will call you with the results by Tuesday. If you do not hear from me by Tuesday afternoon, our office.

## 2016-01-24 NOTE — Progress Notes (Signed)
Subjective:  CC: STD check HPI: Patient is a 20 y.o. male with a past medical history of anxiety, pityriasis lichenoides, Baronella infection presenting to clinic today for an STD check.  The patient has been in a monogamous relationship for the last 4 years, he notes he lost his virginity to his current girlfriend. He notes they intermittently have unprotected intercourse. He noted yesterday what appeared to be a pimple on the lower aspect of his shaft.  Given his significant anxiety and his h/o unprotected intercourse he wanted to get this checked out.  The are does not hurt or burn.  He denies any penile discharge, dysuria, or other lesions. He denies a h/o STDs.   Social History: non-smoker   ROS: All other systems reviewed and are negative.  Past Medical History Patient Active Problem List   Diagnosis Date Noted  . Penile lesion 01/25/2016  . Insomnia 06/01/2015  . Back pain 05/10/2015  . Left knee pain 01/18/2015  . Dysphagia, pharyngoesophageal phase 10/11/2014  . 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 Persistent testicular pain   . Right ankle pain 11/07/2013  . Bilateral foot pain 01/03/2013  . Lactose intolerance 06/09/2012  . Anxiety 05/27/2012  . Multiple somatic complaints 04/08/2012  . Irritable bowel syndrome with diarrhea 03/29/2012  . Other disorders of synovium, tendon, and bursa(727.89) 04/25/2011  . Pityriasis lichenoides [id number]  . RUQ PAIN 10/09/2009    Medications- reviewed and updated Current Outpatient Prescriptions  Medication Sig Dispense Refill  . ALPRAZolam (XANAX XR) 1 MG 24 hr tablet Take 1 mg by mouth every morning. Reported on 11/21/2015  0  . cyclobenzaprine (FLEXERIL) 5 MG tablet Take 1 tablet (5 mg total) by mouth 3 (three) times daily as needed for muscle spasms. (Patient not taking: Reported on 11/21/2015) 30 tablet 1  . ibuprofen (ADVIL,MOTRIN) 800 MG tablet Take 1 tablet (800 mg total) by mouth every 8 (eight) hours as needed. (Patient  not taking: Reported on 11/21/2015) 30 tablet 0  . Melatonin 3 MG TABS Take 1 tablet (3 mg total) by mouth at bedtime. (Patient not taking: Reported on 11/21/2015) 30 tablet 1  . pregabalin (LYRICA) 50 MG capsule Take 1 capsule (50 mg total) by mouth 3 (three) times daily. (Patient not taking: Reported on 11/21/2015) 60 capsule 2   No current facility-administered medications for this visit.    Objective: Office vital signs reviewed. BP 115/82 mmHg  Pulse 127  Temp(Src) 99 F (37.2 C) (Oral)  Wt 172 lb (78.019 kg)   Physical Examination:  General: Awake, alert, well- nourished, NAD, moderately anxious  Genital exam performed with a chaperone.  Well healed surgical scar in the right inguinal region (previous lymph node bx), erythematous papular rash over the inner thighs, stable per the patient since he saw ID for Bartonella.  Circumcised. Small erythematous papule with not surrounding erythema noted at the ventral, proximal aspect of the shaft. No other lesions noted in the hair line, penis, or scrotum. No penile discharge.    Assessment/Plan: Penile lesion Patient presenting to clinic requesting STD check after noting a small bump on the shaft of his penis. Exam and history are consistent with pimple. There is no drainage and nothing to unroof to get a sample for further testing. Dicussed that typically this type of exam is based on clinical examination alone and gave reassurance. Stated the bump should go away in 1-2 weeks. We discussed testing for other STDs which he was very eager to have done. - GC/chlamydia,  HIV, and RPR ordered. - patient would like to be contacted on his mobile phone, can leave message asking him to call us back. - discuss once again that the bump on my exam would resolve in 1-2 weeks and to f/u if it did not (or if it changed in appearance). - discussed safe sex.    Archie Patten PGY-2, [facility] [facility]

## 2016-01-25 DIAGNOSIS — N489 Disorder of penis, unspecified: Secondary | ICD-10-CM | POA: Insufficient documentation

## 2016-01-25 LAB — URINE CYTOLOGY ANCILLARY ONLY
Chlamydia: NEGATIVE
Neisseria Gonorrhea: NEGATIVE

## 2016-01-25 LAB — RPR

## 2016-01-25 NOTE — Assessment & Plan Note (Signed)
Patient presenting to clinic requesting STD check after noting a small bump on the shaft of his penis. Exam and history are consistent with pimple. There is no drainage and nothing to unroof to get a sample for further testing. Dicussed that typically this type of exam is based on clinical examination alone and gave reassurance. Stated the bump should go away in 1-2 weeks. We discussed testing for other STDs which he was very eager to have done. - GC/chlamydia, HIV, and RPR ordered. - patient would like to be contacted on his mobile phone, can leave message asking him to call us back. - discuss once again that the bump on my exam would resolve in 1-2 weeks and to f/u if it did not (or if it changed in appearance). - discussed safe sex.

## 2016-01-28 ENCOUNTER — Telehealth: Payer: Self-pay | Admitting: Family Medicine

## 2016-01-28 NOTE — Telephone Encounter (Signed)
Please call the patient and let him know that all STD testing (chlamydia, gonorrhea, HIV, and syphilis) are negative.  Thanks, Archie Patten, MD [facility] Resident  01/28/2016, 8:30 AM

## 2016-01-29 NOTE — Telephone Encounter (Signed)
Left message on voicemail for patient to call back. 

## 2016-01-30 ENCOUNTER — Telehealth: Payer: Self-pay | Admitting: *Deleted

## 2016-01-30 NOTE — Telephone Encounter (Signed)
Pt informed. Jadzia Ibsen, CMA  

## 2016-01-30 NOTE — Telephone Encounter (Signed)
Pt informed of negative results. Deseree Blount, CMA  

## 2016-02-05 DIAGNOSIS — M25562 Pain in left knee: Secondary | ICD-10-CM | POA: Diagnosis not present

## 2016-02-22 MED FILL — ALPRAZolam ER 1 MG TB24: 1 | 30 days supply | Qty: 30 | Fill #2

## 2016-02-22 MED FILL — ALPRAZolam 0.5 MG TABS: 0.5 | 30 days supply | Qty: 30 | Fill #2

## 2016-03-01 DIAGNOSIS — M25562 Pain in left knee: Secondary | ICD-10-CM | POA: Diagnosis not present

## 2016-03-06 DIAGNOSIS — M25562 Pain in left knee: Secondary | ICD-10-CM | POA: Diagnosis not present

## 2016-03-24 MED FILL — ALPRAZolam 0.5 MG TABS: 0.5 | 30 days supply | Qty: 30 | Fill #3

## 2016-03-24 MED FILL — ALPRAZolam ER 1 MG TB24: 1 | 30 days supply | Qty: 30 | Fill #3

## 2016-04-23 MED FILL — ALPRAZolam ER 1 MG TB24: 1 | 30 days supply | Qty: 30 | Fill #4

## 2016-04-23 MED FILL — ALPRAZolam 0.5 MG TABS: 0.5 | 30 days supply | Qty: 30 | Fill #4

## 2016-05-15 DIAGNOSIS — M25562 Pain in left knee: Secondary | ICD-10-CM | POA: Diagnosis not present

## 2016-05-15 DIAGNOSIS — M79675 Pain in left toe(s): Secondary | ICD-10-CM | POA: Diagnosis not present

## 2016-05-22 DIAGNOSIS — G8929 Other chronic pain: Secondary | ICD-10-CM | POA: Diagnosis not present

## 2016-05-22 DIAGNOSIS — M25562 Pain in left knee: Secondary | ICD-10-CM | POA: Diagnosis not present

## 2016-05-23 MED FILL — ALPRAZolam 0.5 MG TABS: 0.5 | 30 days supply | Qty: 30 | Fill #5

## 2016-05-23 MED FILL — ALPRAZolam ER 1 MG TB24: 1 | 30 days supply | Qty: 30 | Fill #5

## 2016-05-26 ENCOUNTER — Ambulatory Visit (HOSPITAL_COMMUNITY)
Admission: EM | Admit: 2016-05-26 | Discharge: 2016-05-26 | Disposition: A | Discharge status: Home / Self Care | Payer: 59 | Attending: Emergency Medicine | Admitting: Emergency Medicine

## 2016-05-26 ENCOUNTER — Encounter (HOSPITAL_COMMUNITY): Payer: Self-pay | Admitting: Emergency Medicine

## 2016-05-26 ENCOUNTER — Ambulatory Visit (INDEPENDENT_AMBULATORY_CARE_PROVIDER_SITE_OTHER): Payer: 59

## 2016-05-26 DIAGNOSIS — S6991XA Unspecified injury of right wrist, hand and finger(s), initial encounter: Secondary | ICD-10-CM | POA: Diagnosis not present

## 2016-05-26 DIAGNOSIS — S60221A Contusion of right hand, initial encounter: Secondary | ICD-10-CM

## 2016-05-26 DIAGNOSIS — M79641 Pain in right hand: Secondary | ICD-10-CM | POA: Diagnosis not present

## 2016-05-26 NOTE — Discharge Instructions (Signed)
Ice for the next day or 2. Wear the splint for 2 or 3 days for protection of the hand and immobilization. Gradually utilize the hand and digits as tolerated. If there is worsening, new symptoms or problems follow-up with the orthopedist. They take ibuprofen 600 mg every 6 hours as needed for pain.
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
## 2016-06-18 ENCOUNTER — Telehealth: Payer: Self-pay | Admitting: Family Medicine

## 2016-06-18 DIAGNOSIS — M79675 Pain in left toe(s): Secondary | ICD-10-CM | POA: Diagnosis not present

## 2016-06-18 NOTE — Telephone Encounter (Signed)
Mother is calling and wanted to see if Dr. Ree Kida had received her son's records from [facility] when he seen Dr. Doran Durand. Please let her know so that we can make sure we get these so that he can get a referral to Neuro. jw

## 2016-06-19 ENCOUNTER — Ambulatory Visit (INDEPENDENT_AMBULATORY_CARE_PROVIDER_SITE_OTHER): Payer: 59 | Admitting: Family Medicine

## 2016-06-19 ENCOUNTER — Encounter: Payer: Self-pay | Admitting: Family Medicine

## 2016-06-19 VITALS — BP 132/86 | HR 88 | Temp 98.1°F | Ht 71.0 in | Wt 182.4 lb

## 2016-06-19 DIAGNOSIS — Z23 Encounter for immunization: Secondary | ICD-10-CM | POA: Diagnosis not present

## 2016-06-19 DIAGNOSIS — R1011 Right upper quadrant pain: Secondary | ICD-10-CM | POA: Diagnosis not present

## 2016-06-19 LAB — COMPLETE METABOLIC PANEL WITH GFR
ALT: 12 U/L (ref 9–46)
AST: 15 U/L (ref 10–40)
Albumin: 4.7 g/dL (ref 3.6–5.1)
Alkaline Phosphatase: 65 U/L (ref 40–115)
BUN: 12 mg/dL (ref 7–25)
CALCIUM: 9.6 mg/dL (ref 8.6–10.3)
CO2: 27 mmol/L (ref 20–31)
CREATININE: 0.97 mg/dL (ref 0.60–1.35)
Chloride: 104 mmol/L (ref 98–110)
GFR, Est African American: >89 mL/min (ref >=60)
GFR, Est Non African American: >89 mL/min (ref >=60)
GLUCOSE: 110 mg/dL — AB (ref 65–99)
Potassium: 4.3 mmol/L (ref 3.5–5.3)
SODIUM: 140 mmol/L (ref 135–146)
Total Bilirubin: 1.3 mg/dL — ABNORMAL HIGH (ref 0.2–1.2)
Total Protein: 7.3 g/dL (ref 6.1–8.1)
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
## 2016-06-19 NOTE — Telephone Encounter (Signed)
RN staff - please return call to patient and inform him that I have not received the records, please have them ask Dr. Doran Durand to send records, please also schedule follow up with me as I have not seen him in close to a year.
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
## 2016-06-19 NOTE — Patient Instructions (Signed)
It was a pleasure to meet you today! I want to check liver enzymes based on your pain. For the pain, you can try capcasian cream. This is over the counter, and you can apply it to the area that is painful. You could also try icy-hot cream or ointment.

## 2016-06-19 NOTE — Telephone Encounter (Signed)
Pt mom informed and will get them to resend records. Marra Fraga Kennon Holter, CMA

## 2016-06-20 ENCOUNTER — Telehealth: Payer: Self-pay | Admitting: Family Medicine

## 2016-06-20 NOTE — Telephone Encounter (Signed)
Called patient to discuss lab results. Mom answered, she said it would be ok to discuss lab results with her since she was in the whole visit. Explained that liver numbers were totally normal. Bilirubin was very slightly elevated at 1.3, but unchanged from previous. Mom explained that [facility] had some sharp belly pain yesterday that was periumbilical, and went away. I counseled mom that they should go to the ED if this persisted or she was concerned about Mang's pain level. Mom was curious if I thought that Joandry's pain was a pulled muscle. Explained that this was certainly possible, and that Vasilios's chronic pain history could make his body hypersensitive to pain.

## 2016-07-18 ENCOUNTER — Ambulatory Visit: Payer: 59 | Admitting: Family Medicine

## 2016-07-29 ENCOUNTER — Other Ambulatory Visit: Payer: Self-pay | Admitting: *Deleted

## 2016-07-29 ENCOUNTER — Encounter: Payer: Self-pay | Admitting: Neurology

## 2016-07-29 ENCOUNTER — Ambulatory Visit (INDEPENDENT_AMBULATORY_CARE_PROVIDER_SITE_OTHER): Payer: 59 | Admitting: Neurology

## 2016-07-29 VITALS — BP 144/73 | HR 113 | Ht 71.0 in | Wt 179.0 lb

## 2016-07-29 DIAGNOSIS — M25562 Pain in left knee: Secondary | ICD-10-CM

## 2016-07-29 DIAGNOSIS — G8929 Other chronic pain: Secondary | ICD-10-CM

## 2016-07-29 DIAGNOSIS — M79605 Pain in left leg: Secondary | ICD-10-CM | POA: Diagnosis not present

## 2016-07-29 MED ORDER — DULOXETINE HCL 60 MG PO CPEP: 60.0000 mg | ORAL_CAPSULE | Freq: Every day | ORAL | 11 refills | Status: DC

## 2016-07-29 MED FILL — DULoxetine HCL 60 MG CPEP: 60 | 30 days supply | Qty: 30 | Fill #0
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
## 2016-08-04 ENCOUNTER — Ambulatory Visit (HOSPITAL_COMMUNITY)
Admission: RE | Admit: 2016-08-04 | Discharge: 2016-08-04 | Disposition: A | Discharge status: Home / Self Care | Payer: 59 | Source: Ambulatory Visit | Attending: Neurology | Admitting: Neurology

## 2016-08-04 DIAGNOSIS — M25562 Pain in left knee: Secondary | ICD-10-CM | POA: Diagnosis not present

## 2016-08-04 DIAGNOSIS — M79605 Pain in left leg: Secondary | ICD-10-CM

## 2016-08-04 DIAGNOSIS — G8929 Other chronic pain: Secondary | ICD-10-CM | POA: Diagnosis not present

## 2016-08-04 NOTE — Progress Notes (Signed)
VASCULAR LAB PRELIMINARY  ARTERIAL  ABI completed: Normal ABI at rest.     RIGHT    LEFT    PRESSURE WAVEFORM  PRESSURE WAVEFORM  BRACHIAL 131 Triphasic BRACHIAL 127 Triphasic  DP 151 Triphasic DP 147 Triphasic  PT 150 Triphasic PT 144 Triphasic  GREAT TOE  NA GREAT TOE  NA    RIGHT LEFT  ABI 1.1 1.1     Bricelyn Freestone D, RVT 08/04/2016, 2:42 PM

## 2016-11-03 MED FILL — ETODOLAC 400 MG TABLET: 400 | 4 days supply | Qty: 12 | Fill #0

## 2016-11-04 MED FILL — AMOXICILLIN 500 MG CAPSULE: 500 | 7 days supply | Qty: 28 | Fill #0

## 2016-11-07 MED FILL — ETODOLAC 400 MG TABLET: 400 | 4 days supply | Qty: 12 | Fill #1

## 2017-04-01 DIAGNOSIS — M25562 Pain in left knee: Secondary | ICD-10-CM | POA: Diagnosis not present

## 2017-04-01 DIAGNOSIS — M25572 Pain in left ankle and joints of left foot: Secondary | ICD-10-CM | POA: Diagnosis not present

## 2017-04-10 DIAGNOSIS — N5082 Scrotal pain: Secondary | ICD-10-CM | POA: Diagnosis not present

## 2017-04-10 DIAGNOSIS — M25572 Pain in left ankle and joints of left foot: Secondary | ICD-10-CM | POA: Diagnosis not present

## 2017-04-10 DIAGNOSIS — M25562 Pain in left knee: Secondary | ICD-10-CM | POA: Diagnosis not present

## 2017-04-15 DIAGNOSIS — N5082 Scrotal pain: Secondary | ICD-10-CM | POA: Diagnosis not present

## 2017-04-15 DIAGNOSIS — M25572 Pain in left ankle and joints of left foot: Secondary | ICD-10-CM | POA: Diagnosis not present

## 2017-04-15 DIAGNOSIS — M25562 Pain in left knee: Secondary | ICD-10-CM | POA: Diagnosis not present

## 2017-04-20 DIAGNOSIS — M5082 Other cervical disc disorders, mid-cervical region: Secondary | ICD-10-CM | POA: Diagnosis not present

## 2017-04-20 DIAGNOSIS — M25562 Pain in left knee: Secondary | ICD-10-CM | POA: Diagnosis not present

## 2017-04-20 DIAGNOSIS — M25572 Pain in left ankle and joints of left foot: Secondary | ICD-10-CM | POA: Diagnosis not present

## 2017-04-27 DIAGNOSIS — M25562 Pain in left knee: Secondary | ICD-10-CM | POA: Diagnosis not present

## 2017-04-27 DIAGNOSIS — N5082 Scrotal pain: Secondary | ICD-10-CM | POA: Diagnosis not present

## 2017-04-27 DIAGNOSIS — M25572 Pain in left ankle and joints of left foot: Secondary | ICD-10-CM | POA: Diagnosis not present

## 2017-04-29 DIAGNOSIS — M25562 Pain in left knee: Secondary | ICD-10-CM | POA: Diagnosis not present

## 2017-04-29 DIAGNOSIS — M25572 Pain in left ankle and joints of left foot: Secondary | ICD-10-CM | POA: Diagnosis not present

## 2017-04-29 DIAGNOSIS — N5082 Scrotal pain: Secondary | ICD-10-CM | POA: Diagnosis not present

## 2017-05-04 DIAGNOSIS — M25572 Pain in left ankle and joints of left foot: Secondary | ICD-10-CM | POA: Diagnosis not present

## 2017-05-04 DIAGNOSIS — N5082 Scrotal pain: Secondary | ICD-10-CM | POA: Diagnosis not present

## 2017-05-04 DIAGNOSIS — M25562 Pain in left knee: Secondary | ICD-10-CM | POA: Diagnosis not present

## 2017-05-11 DIAGNOSIS — M25572 Pain in left ankle and joints of left foot: Secondary | ICD-10-CM | POA: Diagnosis not present

## 2017-05-11 DIAGNOSIS — M25562 Pain in left knee: Secondary | ICD-10-CM | POA: Diagnosis not present

## 2017-05-11 DIAGNOSIS — N5082 Scrotal pain: Secondary | ICD-10-CM | POA: Diagnosis not present

## 2017-05-14 DIAGNOSIS — N5082 Scrotal pain: Secondary | ICD-10-CM | POA: Diagnosis not present

## 2017-05-14 DIAGNOSIS — M25572 Pain in left ankle and joints of left foot: Secondary | ICD-10-CM | POA: Diagnosis not present

## 2017-05-14 DIAGNOSIS — M25562 Pain in left knee: Secondary | ICD-10-CM | POA: Diagnosis not present

## 2017-05-19 DIAGNOSIS — N5082 Scrotal pain: Secondary | ICD-10-CM | POA: Diagnosis not present

## 2017-05-19 DIAGNOSIS — M25562 Pain in left knee: Secondary | ICD-10-CM | POA: Diagnosis not present

## 2017-05-19 DIAGNOSIS — M25572 Pain in left ankle and joints of left foot: Secondary | ICD-10-CM | POA: Diagnosis not present

## 2017-05-26 DIAGNOSIS — M25572 Pain in left ankle and joints of left foot: Secondary | ICD-10-CM | POA: Diagnosis not present

## 2017-05-26 DIAGNOSIS — M25562 Pain in left knee: Secondary | ICD-10-CM | POA: Diagnosis not present

## 2017-05-26 DIAGNOSIS — N5082 Scrotal pain: Secondary | ICD-10-CM | POA: Diagnosis not present

## 2017-07-17 DIAGNOSIS — M25562 Pain in left knee: Secondary | ICD-10-CM | POA: Diagnosis not present

## 2017-07-17 DIAGNOSIS — M25572 Pain in left ankle and joints of left foot: Secondary | ICD-10-CM | POA: Diagnosis not present

## 2017-07-17 DIAGNOSIS — N5082 Scrotal pain: Secondary | ICD-10-CM | POA: Diagnosis not present

## 2017-07-21 DIAGNOSIS — N5082 Scrotal pain: Secondary | ICD-10-CM | POA: Diagnosis not present

## 2017-07-21 DIAGNOSIS — M25572 Pain in left ankle and joints of left foot: Secondary | ICD-10-CM | POA: Diagnosis not present

## 2017-07-21 DIAGNOSIS — M25562 Pain in left knee: Secondary | ICD-10-CM | POA: Diagnosis not present

## 2017-07-24 DIAGNOSIS — M25562 Pain in left knee: Secondary | ICD-10-CM | POA: Diagnosis not present

## 2017-07-24 DIAGNOSIS — M25572 Pain in left ankle and joints of left foot: Secondary | ICD-10-CM | POA: Diagnosis not present

## 2017-07-24 DIAGNOSIS — N5082 Scrotal pain: Secondary | ICD-10-CM | POA: Diagnosis not present

## 2018-12-05 ENCOUNTER — Other Ambulatory Visit: Payer: Self-pay

## 2018-12-05 ENCOUNTER — Encounter (HOSPITAL_BASED_OUTPATIENT_CLINIC_OR_DEPARTMENT_OTHER): Payer: Self-pay | Admitting: *Deleted

## 2018-12-05 ENCOUNTER — Emergency Department (HOSPITAL_BASED_OUTPATIENT_CLINIC_OR_DEPARTMENT_OTHER)
Admission: EM | Admit: 2018-12-05 | Discharge: 2018-12-05 | Disposition: A | Discharge status: Home / Self Care | Payer: Worker's Compensation | Attending: Emergency Medicine | Admitting: Emergency Medicine

## 2018-12-05 DIAGNOSIS — W5501XA Bitten by cat, initial encounter: Secondary | ICD-10-CM | POA: Diagnosis not present

## 2018-12-05 DIAGNOSIS — Y939 Activity, unspecified: Secondary | ICD-10-CM | POA: Diagnosis not present

## 2018-12-05 DIAGNOSIS — Y929 Unspecified place or not applicable: Secondary | ICD-10-CM | POA: Insufficient documentation

## 2018-12-05 DIAGNOSIS — S60410A Abrasion of right index finger, initial encounter: Secondary | ICD-10-CM | POA: Diagnosis not present

## 2018-12-05 DIAGNOSIS — Y990 Civilian activity done for income or pay: Secondary | ICD-10-CM | POA: Insufficient documentation

## 2018-12-05 DIAGNOSIS — S61451A Open bite of right hand, initial encounter: Secondary | ICD-10-CM | POA: Diagnosis not present

## 2018-12-05 MED ORDER — AMOXICILLIN-POT CLAVULANATE 875-125 MG PO TABS
1.0000 | ORAL_TABLET | Freq: Once | ORAL | Status: AC
  Administered 2018-12-05: 20:00:00 1 via ORAL
  Filled 2018-12-05: qty 1

## 2018-12-05 MED ORDER — AMOXICILLIN-POT CLAVULANATE 875-125 MG PO TABS: 1.0000 | ORAL_TABLET | Freq: Two times a day (BID) | ORAL | 0 refills | Status: DC

## 2018-12-05 NOTE — Discharge Instructions (Addendum)
Please take Ibuprofen (Advil, motrin) and Tylenol (acetaminophen) to relieve your pain.  You may take up to 600 MG (3 pills) of normal strength ibuprofen every 8 hours as needed.  In between doses of ibuprofen you make take tylenol, up to 1,000 mg (two extra strength pills).  Do not take more than 3,000 mg tylenol in a 24 hour period.  Please check all medication labels as many medications such as pain and cold medications may contain tylenol.  Do not drink alcohol while taking these medications.  Do not take other NSAID'S while taking ibuprofen (such as aleve or naproxen).  Please take ibuprofen with food to decrease stomach upset.  You may have diarrhea from the antibiotics.  It is very important that you continue to take the antibiotics even if you get diarrhea unless a medical professional tells you that you may stop taking them.  If you stop too early the bacteria you are being treated for will become stronger and you may need different, more powerful antibiotics that have more side effects and worsening diarrhea.  Please stay well hydrated and consider probiotics as they may decrease the severity of your diarrhea.    Unless you have concerns or signs of infection you do not need any specific follow up.

## 2018-12-05 NOTE — ED Provider Notes (Signed)
[facility] Provider Note   CSN: [id number] Arrival date & time: 12/05/18  1912    History   Chief Complaint Chief Complaint  Patient presents with  . Animal Bite    HPI Cody Todd is a 23 y.o. male with a past medical history of cat scratch disease, who presents today for evaluation after a cat bite.  He reports that he works for an [employer] and was bitten on the right index finger by a cat.  He was wearing Gloves at the time.  He reports that the cat is up-to-date on its rabies vaccine.  His last tetanus was in 2016.  Reports that it bled barely a drop, he states that at work told him he needed to come and get checked out, he is unsure if he would have, otherwise.  Bite happened shortly PTA.      HPI  Past Medical History:  Diagnosis Date  . Anxiety   . Cat-scratch disease   . Cat-scratch disease   . Hematuria, microscopic 05/2014  . High serum Bartonella henselae antibody titer   . Irritable bowel syndrome with diarrhea    no current med.  . Joint pain 06/13/2014   knees and legs  . Lymphadenopathy, inguinal 06/2014   right  . Persistent testicular pain   . Pityriasis lichenoides    arms and legs  . Skin ulcer ([place])   . Toe pain     Patient Active Problem List   Diagnosis Date Noted  . Left leg pain 07/29/2016  . Penile lesion 01/25/2016  . Insomnia 06/01/2015  . Back pain 05/10/2015  . Left knee pain 01/18/2015  . Dysphagia, pharyngoesophageal phase 10/11/2014  . 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 Persistent testicular pain   . Right ankle pain 11/07/2013  . Bilateral foot pain 01/03/2013  . Lactose intolerance 06/09/2012  . Anxiety 05/27/2012  . Multiple somatic complaints 04/08/2012  . Irritable bowel syndrome with diarrhea 03/29/2012  . Other disorders of synovium, tendon, and bursa(727.89) 04/25/2011  . Pityriasis lichenoides 07/62/2633  . RUQ PAIN 10/09/2009    Past Surgical History:  Procedure  Laterality Date  . ANKLE ARTHROSCOPY WITH DRILLING/MICROFRACTURE Right 12/15/2013   Procedure: RIGHT ANKLE ARTHROSCOPY WITH MICRO FRACTURE ;  Surgeon: Wylene Simmer, MD;  Location: [facility];  Service: Orthopedics;  Laterality: Right;  . LYMPH NODE BIOPSY Right 06/15/2014   Procedure: RIGHT FEMORAL LYMPH NODE BIOPSY;  Surgeon: Donnie Mesa, MD;  Location: [facility];  Service: General;  Laterality: Right;        Home Medications    Prior to Admission medications   Medication Sig Start Date End Date Taking? Authorizing Provider  ALPRAZolam (XANAX XR) 1 MG 24 hr tablet Take 1 mg by mouth every morning. Reported on 11/21/2015 11/22/14   [provider]  amoxicillin-clavulanate (AUGMENTIN) 875-125 MG tablet Take 1 tablet by mouth every 12 (twelve) hours. 12/05/18   Lorin Glass, PA-C  DULoxetine (CYMBALTA) 60 MG capsule Take 1 capsule (60 mg total) by mouth daily. 07/29/16   Marcial Pacas, MD    Family History Family History  Problem Relation Age of Onset  . Mitral valve prolapse Maternal Grandmother   . Cancer Maternal Grandmother        uterine  . Stroke Maternal Grandmother   . Dementia Maternal Grandfather   . Thyroid disease Maternal Grandfather   . COPD Maternal Grandfather   . Cancer Paternal Grandfather  renal  . Heart disease Paternal Grandfather   . Obesity Father   . ADD / ADHD Brother   . Depression Brother   . Hypertension Mother     Social History Social History   Tobacco Use  . Smoking status: Never Smoker  . Smokeless tobacco: Never Used  Substance Use Topics  . Alcohol use: Yes    Comment: Drinks bottle of liquor every two days  . Drug use: Not Currently    Frequency: 7.0 times per week    Types: Marijuana    Comment: smokes "marijuana wax" daily     Allergies   Lactose intolerance (gi)   Review of Systems Review of Systems  Constitutional: Negative for chills and fever.  Skin: Positive for wound.   All other systems reviewed and are negative.    Physical Exam Updated Vital Signs BP 129/76 (BP Location: Right Arm)   Pulse 75   Temp 98.6 F (37 C) (Oral)   Resp 16   Ht 5\' 11"  (1.803 m)   Wt 74.8 kg   SpO2 100%   BMI 23.01 kg/m   Physical Exam Vitals signs and nursing note reviewed.  Constitutional:      General: He is not in acute distress. HENT:     Head: Normocephalic.  Cardiovascular:     Rate and Rhythm: Normal rate.     Pulses: Normal pulses.  Musculoskeletal:     Comments: Full active range of motion's of fingers on bilateral hands.  Skin:    General: Skin is warm and dry.     Capillary Refill: Capillary refill takes less than 2 seconds.     Comments: Right index finger: Please see clinical image.  There is a 1 to 2 mm superficial abrasion on the distal palmar surface of the finger.  There is no active bleeding.  No surrounding erythema, edema, or ecchymosis.  Neurological:     General: No focal deficit present.     Mental Status: He is alert. Mental status is at baseline.     Cranial Nerves: No cranial nerve deficit.     Sensory: No sensory deficit.  Psychiatric:        Mood and Affect: Mood normal.        Behavior: Behavior normal.    Wound on distal finger.      ED Treatments / Results  Labs (all labs ordered are listed, but only abnormal results are displayed) Labs Reviewed - No data to display  EKG None  Radiology No results found.  Procedures Procedures (including critical care time)  Medications Ordered in ED Medications  amoxicillin-clavulanate (AUGMENTIN) 875-125 MG per tablet 1 tablet (has no administration in time range)     Initial Impression / Assessment and Plan / ED Course  I have reviewed the triage vital signs and the nursing notes.  Pertinent labs & imaging results that were available during my care of the patient were reviewed by me and considered in my medical decision making (see chart for details).        Patient presents today for evaluation of a cat bite to his right index finger that occurred shortly prior to arrival.  He works at [employer] and was bitten by a cat through thick gloves.  His tetanus is up-to-date, the cat is up-to-date on rabies vaccines per patient.  Wound is minimal, difficulty finding wound with no active bleeding.  Patient has a history of cat scratch fever, and he requested antibiotics stating that  it would make him feel better.  We discussed the risk of diarrhea, allergic reaction and other adverse effects and he still wishes for antibiotics.   Final Clinical Impressions(s) / ED Diagnoses   Final diagnoses:  Cat bite, initial encounter    ED Discharge Orders         Ordered    amoxicillin-clavulanate (AUGMENTIN) 875-125 MG tablet  Every 12 hours     12/05/18 1950           Ollen Gross 12/05/18 1957    Malvin Johns, MD 12/05/18 [id number]

## 2018-12-05 NOTE — ED Triage Notes (Addendum)
Pt works for an [employer] and was bitten on right index finger through cat gloves by a cat. Pt is unsure if animal is UTD on rabies vaccine but it is currently staying at the [facility] [place]

## 2018-12-14 ENCOUNTER — Other Ambulatory Visit: Payer: Self-pay

## 2018-12-14 ENCOUNTER — Ambulatory Visit (INDEPENDENT_AMBULATORY_CARE_PROVIDER_SITE_OTHER): Payer: 59 | Admitting: Family Medicine

## 2018-12-14 VITALS — BP 120/68 | HR 95 | Ht 71.0 in | Wt 166.0 lb

## 2018-12-14 DIAGNOSIS — R1031 Right lower quadrant pain: Secondary | ICD-10-CM | POA: Insufficient documentation

## 2018-12-14 NOTE — Assessment & Plan Note (Addendum)
Patient presented with worsening RLQ pain for the past 5 days. Endorses some nausea associated with it. On exam, tender with minimal palpation in RLQ with positive obturator sign. No fever, anorexia or change in BM. Patient has had a chronic testicular pain for the past 2 years. Given symptoms on presentation, will order RLQ [place] to rule out acute appendicitis. We will also order CBC to check for leukocytosis. Patient will follow up with PCP to discuss chronic testicular pain which could be from nerve damage after having right inguinal lymph node biopsy a few years ago after contracting Bartonella but could also be an inguinal hernia, differential can be broad.

## 2018-12-14 NOTE — Progress Notes (Signed)
   Subjective:    Patient ID: Cody Todd, male    DOB: [birth year], 23 y.o.   MRN: [id number]   CC: RLQ pain   HPI: Patient is a 23 yo male with a complex past medical history who present today complaining of RLQ pain. Patient reports that he has been experiencing right lower quadrant pain since Friday and pain has gradually worsened. Pain is associated with nausea, but denies any vomiting. Patient has good appetite and denies any LLQ/periumbilical pain, anorexia. Patient has not taken any medication for pain control. He denies any fever. Patient continue to have normal BM. No diarrhea.   Smoking status reviewed   ROS: all other systems were reviewed and are negative other than in the HPI   Past Medical History:  Diagnosis Date  . Anxiety   . Cat-scratch disease   . Cat-scratch disease   . Hematuria, microscopic 05/2014  . High serum Bartonella henselae antibody titer   . Irritable bowel syndrome with diarrhea    no current med.  . Joint pain 06/13/2014   knees and legs  . Lymphadenopathy, inguinal 06/2014   right  . Persistent testicular pain   . Pityriasis lichenoides    arms and legs  . Skin ulcer ([place])   . Toe pain     Past Surgical History:  Procedure Laterality Date  . ANKLE ARTHROSCOPY WITH DRILLING/MICROFRACTURE Right 12/15/2013   Procedure: RIGHT ANKLE ARTHROSCOPY WITH MICRO FRACTURE ;  Surgeon: Wylene Simmer, MD;  Location: [facility];  Service: Orthopedics;  Laterality: Right;  . LYMPH NODE BIOPSY Right 06/15/2014   Procedure: RIGHT FEMORAL LYMPH NODE BIOPSY;  Surgeon: Donnie Mesa, MD;  Location: [facility];  Service: General;  Laterality: Right;    Past medical history, surgical, family, and social history reviewed and updated in the EMR as appropriate.  Objective:  BP 120/68   Pulse 95   Ht 5\' 11"  (1.803 m)   Wt 166 lb (75.3 kg)   SpO2 97%   BMI 23.15 kg/m   Vitals and nursing note reviewed  General: NAD, pleasant, able  to participate in exam Cardiac: RRR, normal heart sounds, no murmurs. 2+ radial and PT pulses bilaterally Respiratory: CTAB, normal effort, No wheezes, rales or rhonchi Abdomen: RLQ tenderness to palpation, positive obturator sign, mild guarding no rebound tenderness Extremities: no edema or cyanosis. WWP. Skin: warm and dry, no rashes noted Neuro: alert and oriented x4, no focal deficits Psych: Normal affect and mood   Assessment & Plan:   RLQ abdominal pain Patient presented with worsening RLQ pain for the past 5 days. Endorses some nausea associated with it. On exam, tender with minimal palpation in RLQ with positive obturator sign. No fever, anorexia or change in BM. Patient has had a chronic testicular pain for the past 2 years. Given symptoms on presentation, will order RLQ [place] to rule out acute appendicitis. We will also order CBC to check for leukocytosis. Patient will follow up with PCP to discuss chronic testicular pain which could be from nerve damage after having right inguinal lymph node biopsy a few years ago after contracting Bartonella but could also be an inguinal hernia, differential can be broad.   Marjie Skiff, MD [facility] PGY-3

## 2018-12-15 ENCOUNTER — Telehealth: Payer: Self-pay | Admitting: Family Medicine

## 2018-12-15 ENCOUNTER — Ambulatory Visit (HOSPITAL_COMMUNITY)
Admission: RE | Admit: 2018-12-15 | Discharge: 2018-12-15 | Disposition: A | Discharge status: Home / Self Care | Payer: 59 | Source: Ambulatory Visit | Attending: Family Medicine | Admitting: Family Medicine

## 2018-12-15 ENCOUNTER — Other Ambulatory Visit: Payer: Self-pay | Admitting: Family Medicine

## 2018-12-15 ENCOUNTER — Encounter (HOSPITAL_COMMUNITY): Payer: Self-pay

## 2018-12-15 DIAGNOSIS — R1031 Right lower quadrant pain: Secondary | ICD-10-CM | POA: Diagnosis not present

## 2018-12-15 LAB — CBC WITH DIFFERENTIAL/PLATELET
Basophils Absolute: 0 10*3/uL (ref 0.0–0.2)
Basos: 1 %
EOS (ABSOLUTE): 0.1 10*3/uL (ref 0.0–0.4)
Eos: 1 %
Hematocrit: 44.6 % (ref 37.5–51.0)
Hemoglobin: 15.6 g/dL (ref 13.0–17.7)
Immature Grans (Abs): 0 10*3/uL (ref 0.0–0.1)
Immature Granulocytes: 0 %
Lymphocytes Absolute: 2.2 10*3/uL (ref 0.7–3.1)
Lymphs: 30 %
MCH: 32.4 pg (ref 26.6–33.0)
MCHC: 35 g/dL (ref 31.5–35.7)
MCV: 93 fL (ref 79–97)
Monocytes Absolute: 0.5 10*3/uL (ref 0.1–0.9)
Monocytes: 7 %
Neutrophils Absolute: 4.4 10*3/uL (ref 1.4–7.0)
Neutrophils: 61 %
Platelets: 251 10*3/uL (ref 150–450)
RBC: 4.81 x10E6/uL (ref 4.14–5.80)
RDW: 12 % (ref 11.6–15.4)
WBC: 7.2 10*3/uL (ref 3.4–10.8)

## 2018-12-15 MED ORDER — IOHEXOL 300 MG/ML  SOLN
100.0000 mL | Freq: Once | INTRAMUSCULAR | Status: AC | PRN
  Administered 2018-12-15: 100 mL via INTRAVENOUS

## 2018-12-15 NOTE — Telephone Encounter (Signed)
Called by [place] tech because they are unable to perform ultrasound in patients over 18 to assess for appendicitis.  This is according to their protocol.  She says that when she is previously asked about going off of this protocol she has been told by the radiologist that it is not allowed.  I have reached out to Dr. Andy Gauss to determine what he thinks would be best as he saw this patient yesterday.  I called and talked to [facility] (he was in the imaging waiting room) to let him know that we would be calling him back within the next hour with an alternative plan.  He says he feels his pain is about the same as yesterday, no worse.  He is somewhat worried about an inguinal hernia.  He notes he has had decreased appetite, but denies fevers or chills.  His last bowel movement was 10 PM last night and reportedly normal.  He is able to ambulate without significant pain.  CBC still pending.  I or Dr. Andy Gauss will call the patient back with an alternative plan for imaging and further work-up.  I did counsel Abdalla that if his pain worsens before we are able to image him that he would need to go to the emergency room.  He voiced understanding.

## 2018-12-16 ENCOUNTER — Telehealth: Payer: Self-pay

## 2018-12-16 NOTE — Telephone Encounter (Signed)
Patient calls nurse line requesting recent CT results. Please advise. Will forward to PCP and D[place] who saw patient.

## 2018-12-17 NOTE — Telephone Encounter (Signed)
Please inform patient CT results were reassuring. He can follow with PCP Dr. Lindell Noe at next week appointment.   Thank you   Trinisha Paget

## 2018-12-17 NOTE — Telephone Encounter (Signed)
Spoke with patient and let him know of results.  Patient said that he has a couple of questions before his follow up appt.  He is still experiencing pain and just wanted to talk with her before the weekend.  Jazmin Hartsell,CMA

## 2018-12-17 NOTE — Telephone Encounter (Signed)
Called patient back. He has continued RLQ pain which has not changed since last Friday. He says it is more dull, hurts worse when he presses on it. He feels like he had this before several years ago, and when he was at PT they were pressing in this area and it was hurting rather than sore. He is somewhat disappointed that the CT was reassuring as he feels like he doesn't have an answer for belly pain. He is also dealing with testicular pain for several years and feels like this is not fixable. He reports he has seen a GI and urology and feels like they didn't really have an answer. He is now concerned about a hernia as he discussed this with Dr. Andy Gauss. I discussed that CT is not 100% sensitive for a hernia, and that an exam by an experienced surgeon is likely more sensitive than this.   Differential for this includes IBD, possible anterior cutaneous nerve entrapment, chronic abdominal pain.   We opted to reschedule his appt for Monday at 250 in my same day schedule instead of 6/12. We will work then to determine which additional workup would be best in terms of GI vs gen surg vs urology. He will go to the ED if things worsen in the mean time.

## 2018-12-20 ENCOUNTER — Other Ambulatory Visit: Payer: Self-pay

## 2018-12-20 ENCOUNTER — Encounter: Payer: Self-pay | Admitting: Family Medicine

## 2018-12-20 ENCOUNTER — Ambulatory Visit (INDEPENDENT_AMBULATORY_CARE_PROVIDER_SITE_OTHER): Payer: 59 | Admitting: Family Medicine

## 2018-12-20 VITALS — BP 110/66 | HR 86

## 2018-12-20 DIAGNOSIS — N50819 Testicular pain, unspecified: Secondary | ICD-10-CM

## 2018-12-20 DIAGNOSIS — N509 Disorder of male genital organs, unspecified: Secondary | ICD-10-CM | POA: Diagnosis not present

## 2018-12-20 NOTE — Patient Instructions (Addendum)
The medicines I was thinking could be helpful would be gabapentin or elavil (amitriptyline).  They should call you with the urology appt. Call me if not.

## 2018-12-20 NOTE — Progress Notes (Signed)
CC: abd and testicular pain   HPI  Testicular pain - has seen several urologists and gen surg. Now both testicles hurt, but started with the R. No improvement since 2016, does not note off and on swelling. He notices frequent nausea due to this. Feels like constantly being kicked in the testes. Pain is improved with supportive underwear. Has good days and bad days, also can't attribute a trigger to this. Has tried being off of caffiene, alcohol, gluten to try to find a trigger. On the bad days, he is still able to work. He notices help with using THC for the pain. advil has helped some.   Abd pain - not a terrible day. Feels getting off work earlier helped. Not sure exactly what makes it worse. Feels like the pain causes him to sharply inhale after walking down the stairs. Hard bowel movements lately, no pain with BMs. Appetite is normal. Last Friday was when this started. No trauma. No dietary changes. Sexually active with women, no new partners recently. No penile discharge or STI concerns. He does wonder about IBS. The pain is radiating around the R side of his abdomen. He sometimes notices an ache in his abdomen about 10 min after this happens.   ROS: Denies CP, SOB, + abdominal pain, no dysuria, no changes in BMs.   CC, SH/smoking status, and VS noted  Objective: BP 110/66   Pulse 86   SpO2 97%  Gen: NAD, alert, cooperative, and pleasant. HEENT: NCAT, EOMI, PERRL CV: RRR, no murmur Resp: CTAB, no wheezes, non-labored Abd: SNTND, BS present, no guarding or organomegaly GU: normal testes and scrotum, well healed scar in R inguinal crease, I cannot appreciate inguinal hernias on either side. Nontender epididymus bilaterally.  Ext: No edema, warm Neuro: Alert and oriented, Speech clear, No gross deficits  Assessment and plan:  Chronic testicular pain: I have extensively reviewed his work-up from 2016 including 3 separate urology evaluations imaging and ID opinion.  ID seem to think  that there may be a component of lymphedema or residual pain from his lymph node biopsy.  Patient is quite concerned with finding out the exact cause of his pain.  He recently had a CT that was reassuring.  He was concerned about a hernia, but I cannot appreciate this on exam.  If he continues to be worried about this of course we could refer to general surgery for an additional exam by someone more experience with hernias, but I suspect this is not the cause as he is not having constant pain and is not having any bowel changes.  I explained to him that he may have some chronic nerve damage from the biopsy for which he may benefit from a medicine like gabapentin or a TCA, he states he really does not want to take any medicine for the pain right now.  We agreed to attempt a second opinion with a tertiary center urologist such as [facility] or [facility].  He thinks [facility] may be more convenient for him.  Referral placed.  I offered to trial a TCA or gabapentin to see if this would help while he waits for the referral to go through, but he declined.  Abdominal pain: He has had a work-up for IBS for this in the past.  He feels this is somewhat related to his testicular pain.  Recent reassuring CT.  He declined referral back to GI at present.  We will continue to monitor.  I am curious  whether this is also related to his chronic testicular pain.  Orders Placed This Encounter  Procedures  . Ambulatory referral to Urology    Referral Priority:   Routine    Referral Type:   Consultation    Referral Reason:   Second Opinion    Requested Specialty:   Urology    Number of Visits Requested:   1    No orders of the defined types were placed in this encounter.   Ralene Ok, MD, PGY3 12/22/2018 3:26 PM

## 2018-12-24 ENCOUNTER — Ambulatory Visit: Payer: 59 | Admitting: Family Medicine

## 2019-02-18 ENCOUNTER — Other Ambulatory Visit: Payer: Self-pay

## 2019-02-18 DIAGNOSIS — Z20822 Contact with and (suspected) exposure to covid-19: Secondary | ICD-10-CM

## 2019-02-19 LAB — NOVEL CORONAVIRUS, NAA: SARS-CoV-2, NAA: NOT DETECTED

## 2022-02-13 ENCOUNTER — Ambulatory Visit (HOSPITAL_COMMUNITY)
Admission: RE | Admit: 2022-02-13 | Discharge: 2022-02-13 | Disposition: A | Discharge status: Home / Self Care | Payer: 59 | Source: Ambulatory Visit | Attending: Family Medicine | Admitting: Family Medicine

## 2022-02-13 ENCOUNTER — Encounter (HOSPITAL_COMMUNITY): Payer: Self-pay

## 2022-02-13 VITALS — BP 150/84 | HR 127 | Temp 99.1°F | Resp 18

## 2022-02-13 DIAGNOSIS — L255 Unspecified contact dermatitis due to plants, except food: Secondary | ICD-10-CM

## 2022-02-13 MED ORDER — PREDNISONE 10 MG (48) PO TBPK: ORAL_TABLET | ORAL | 0 refills | Status: AC

## 2022-02-13 NOTE — ED Triage Notes (Signed)
Pt c/o poison ivy all over since yesterday after working in the yard. States has taken predisone '60mg'$  yesterday and '40mg'$  so far today.

## 2022-02-13 NOTE — ED Provider Notes (Signed)
[facility]   [id number] 02/13/22 Arrival Time: 1540  ASSESSMENT & PLAN:  1. Rhus dermatitis    No signs of bacterial skin infection. Begin: Meds ordered this encounter  Medications   predniSONE (STERAPRED UNI-PAK 48 TAB) 10 MG (48) TBPK tablet    Sig: Take as directed.    Dispense:  48 tablet    Refill:  0    Will follow up with PCP or here if worsening or failing to improve as anticipated. Reviewed expectations re: course of current medical issues. Questions answered. Outlined signs and symptoms indicating need for more acute intervention. Patient verbalized understanding. After Visit Summary given.   SUBJECTIVE:  Cody Todd is a 26 y.o. male who presents with a skin complaint.  Pt c/o poison ivy all over since yesterday after working in the yard. States has taken predisone '60mg'$  yesterday and '40mg'$  so far today. Itching is significant. Afebrile.   OBJECTIVE: Vitals:   02/13/22 1805  BP: (!) 150/84  Pulse: (!) 127  Resp: 18  Temp: 99.1 F (37.3 C)  TempSrc: Oral  SpO2: 97%    Tachycardia noted. "Worked up from the prednisone". General appearance: alert; no distress HEENT: [place]; AT Neck: supple with FROM Lungs: clear to auscultation bilaterally Heart: regular Extremities: no edema; moves all extremities normally Skin: warm and dry; signs of infection: no; areas of linear papules and vesicles with surrounding erythema over trunk and extremities Psychological: alert and cooperative; normal mood and affect  Allergies  Allergen Reactions   Lactose Intolerance (Gi) Diarrhea    Past Medical History:  Diagnosis Date   Anxiety    Cat-scratch disease    Cat-scratch disease    Hematuria, microscopic 05/2014   High serum Bartonella henselae antibody titer    Irritable bowel syndrome with diarrhea    no current med.   Joint pain 06/13/2014   knees and legs   Lymphadenopathy, inguinal 06/2014   right   Persistent testicular pain    Pityriasis  lichenoides    arms and legs   Skin ulcer ([place])    Toe pain    Social History   Socioeconomic History   Marital status: Single    Spouse name: Not on file   Number of children: 0   Years of education: HS   Highest education level: Not on file  Occupational History   Occupation: Unemployed  Tobacco Use   Smoking status: Never   Smokeless tobacco: Never  Vaping Use   Vaping Use: Former  Substance and Sexual Activity   Alcohol use: Yes    Comment: Drinks bottle of liquor every two days   Drug use: Not Currently    Frequency: 7.0 times per week    Types: Marijuana    Comment: smokes "marijuana wax" daily   Sexual activity: Yes    Partners: Female    Birth control/protection: Condom    Comment: 1 lifetime partner (prefers females)  Other Topics Concern   Not on file  Social History Narrative   Lives with mom and dad and 3 brothers.   Right-handed.   No caffeine use.   Social Determinants of Health   Financial Resource Strain: Not on file  Food Insecurity: Not on file  Transportation Needs: Not on file  Physical Activity: Not on file  Stress: Not on file  Social Connections: Not on file  Intimate Partner Violence: Not on file   Family History  Problem Relation Age of Onset   Mitral valve prolapse Maternal  Grandmother    Cancer Maternal Grandmother        uterine   Stroke Maternal Grandmother    Dementia Maternal Grandfather    Thyroid disease Maternal Grandfather    COPD Maternal Grandfather    Cancer Paternal Grandfather        renal   Heart disease Paternal Grandfather    Obesity Father    ADD / ADHD Brother    Depression Brother    Hypertension Mother    Past Surgical History:  Procedure Laterality Date   ANKLE ARTHROSCOPY WITH DRILLING/MICROFRACTURE Right 12/15/2013   Procedure: RIGHT ANKLE ARTHROSCOPY WITH MICRO FRACTURE ;  Surgeon: Wylene Simmer, MD;  Location: [facility];  Service: Orthopedics;  Laterality: Right;   LYMPH NODE BIOPSY  Right 06/15/2014   Procedure: RIGHT FEMORAL LYMPH NODE BIOPSY;  Surgeon: Donnie Mesa, MD;  Location: [facility];  Service: General;  Laterality: Right;      Vanessa Kick, MD 02/13/22 1905
